# Patient Record
Sex: Female | Born: 1980
Health system: Southern US, Community
[De-identification: ages and names within clinical notes are randomized; demographics above are authoritative.]

## PROBLEM LIST (undated history)

## (undated) DIAGNOSIS — N83209 Unspecified ovarian cyst, unspecified side: Secondary | ICD-10-CM

## (undated) HISTORY — PX: WISDOM TOOTH EXTRACTION: SHX21

---

## 2010-08-07 ENCOUNTER — Encounter: Payer: Self-pay | Admitting: Family Medicine

## 2010-08-07 ENCOUNTER — Inpatient Hospital Stay (INDEPENDENT_AMBULATORY_CARE_PROVIDER_SITE_OTHER)
Admission: RE | Admit: 2010-08-07 | Discharge: 2010-08-07 | Disposition: A | Discharge status: Home / Self Care | Payer: BC Managed Care – PPO | Source: Ambulatory Visit | Attending: Family Medicine | Admitting: Family Medicine

## 2010-08-07 DIAGNOSIS — J069 Acute upper respiratory infection, unspecified: Secondary | ICD-10-CM

## 2010-12-14 NOTE — Progress Notes (Signed)
Summary: POSSIBLE SINUS INFECTION (rm 5)   Vital Signs:  Patient Profile:   30 Years Old Female CC:      hoarse, dry cough, congestion x 2 days Height:     63 inches Weight:      120 pounds O2 Sat:      100 % O2 treatment:    Room Air Temp:     99.0 degrees F oral Pulse rate:   102 / minute Resp:     16 per minute BP sitting:   106 / 73  (left arm) Cuff size:   regular  Vitals Entered By: Lajean Saver RN (August 07, 2010 8:34 AM)                  Updated Prior Medication List: No Medications Current Allergies: No known allergies History of Present Illness Chief Complaint: hoarse, dry cough, congestion x 2 days History of Present Illness:  Subjective: Patient complains of URI symptoms that started 2 days ago with scratchy throat, sinus congestion and hoarseness. + mild cough developed yesterday, non-productive No pleuritic pain No wheezing ?post-nasal drainage No sinus pain/pressure No itchy/red eyes No earache No hemoptysis No SOB No fever, + chills No nausea No vomiting No abdominal pain No diarrhea No skin rashes + fatigue No myalgias No headache Used OTC meds without relief   REVIEW OF SYSTEMS Constitutional Symptoms      Denies fever, chills, night sweats, weight loss, weight gain, and fatigue.  Eyes       Denies change in vision, eye pain, eye discharge, glasses, contact lenses, and eye surgery. Ear/Nose/Throat/Mouth       Complains of sinus problems and hoarseness.      Denies hearing loss/aids, change in hearing, ear pain, ear discharge, dizziness, frequent runny nose, frequent nose bleeds, sore throat, and tooth pain or bleeding.      Comments: scratchy throat Respiratory       Complains of dry cough.      Denies productive cough, wheezing, shortness of breath, asthma, bronchitis, and emphysema/COPD.  Cardiovascular       Denies murmurs, chest pain, and tires easily with exhertion.    Gastrointestinal       Denies stomach pain,  nausea/vomiting, diarrhea, constipation, blood in bowel movements, and indigestion. Genitourniary       Denies painful urination, kidney stones, and loss of urinary control. Neurological       Denies paralysis, seizures, and fainting/blackouts. Musculoskeletal       Denies muscle pain, joint pain, joint stiffness, decreased range of motion, redness, swelling, muscle weakness, and gout.  Skin       Denies bruising, unusual mles/lumps or sores, and hair/skin or nail changes.  Psych       Denies mood changes, temper/anger issues, anxiety/stress, speech problems, depression, and sleep problems. Other Comments: Patient c/o symptoms x 2 days. Taken DayQuil and tylenol Cold. Her son has a recent uncomformed diagnosis of pertusis   Past History:  Past Medical History: Unremarkable  Past Surgical History: Denies surgical history  Social History: Married Never Smoked Alcohol use-no Drug use-no Smoking Status:  never Drug Use:  no   Objective:  Appearance:  Patient appears healthy, stated age, and in no acute distress  Eyes:  Pupils are equal, round, and reactive to light and accomodation.  Extraocular movement is intact.  Conjunctivae are not inflamed.  Ears:  Canals normal.  Tympanic membranes normal.   Nose:  Mildly congested turbinates.  No sinus tenderness  Pharynx:  Normal  Neck:  Supple.  Slightly tender shotty posterior nodes are palpated bilaterally.  Lungs:  Clear to auscultation.  Breath sounds are equal.  Heart:  Regular rate and rhythm without murmurs, rubs, or gallops.  Abdomen:  Nontender without masses or hepatosplenomegaly.  Bowel sounds are present.  No CVA or flank tenderness.  Extremities:  No edema.   Skin:  No rash Assessment New Problems: UPPER RESPIRATORY INFECTION, ACUTE (ICD-465.9)  NO EVIDENCE BACTERIAL INFECTION TODAY  Plan New Medications/Changes: AZITHROMYCIN 250 MG TABS (AZITHROMYCIN) Two tabs by mouth on day 1, then 1 tab daily on days 2 through  5 (Rx void after 08/16/10)  #6 tabs x 0, 08/07/2010, Donna Christen MD BENZONATATE 200 MG CAPS (BENZONATATE) One by mouth hs as needed cough  #12 x 0, 08/07/2010, Donna Christen MD  New Orders: Pulse Oximetry (single measurment) [16109] New Patient Level III [60454] Planning Comments:   Treat symptomatically for now:  Increase fluid intake, begin expectorant/decongestant, topical decongestant,  cough suppressant at bedtime.  If fever/chills/sweats persist, or if not improving 5  days begin Z-pack (given Rx to hold).  Followup with PCP if not improving 7 to 10 days.   The patient and/or caregiver has been counseled thoroughly with regard to medications prescribed including dosage, schedule, interactions, rationale for use, and possible side effects and they verbalize understanding.  Diagnoses and expected course of recovery discussed and will return if not improved as expected or if the condition worsens. Patient and/or caregiver verbalized understanding.  Prescriptions: AZITHROMYCIN 250 MG TABS (AZITHROMYCIN) Two tabs by mouth on day 1, then 1 tab daily on days 2 through 5 (Rx void after 08/16/10)  #6 tabs x 0   Entered and Authorized by:   Donna Christen MD   Signed by:   Donna Christen MD on 08/07/2010   Method used:   Print then Give to Patient   RxID:   [id number] BENZONATATE 200 MG CAPS (BENZONATATE) One by mouth hs as needed cough  #12 x 0   Entered and Authorized by:   Donna Christen MD   Signed by:   Donna Christen MD on 08/07/2010   Method used:   Print then Give to Patient   RxID:   [id number]   Patient Instructions: 1)  Take Mucinex D (guaifenesin with decongestant) twice daily for congestion. 2)  Increase fluid intake, rest. 3)  May use Afrin nasal spray (or generic oxymetazoline) twice daily for about 5 days.  Also recommend using saline nasal spray several times daily and/or saline nasal irrigation. 4)  Begin Azithromycin if not improving about 5 days or if persistent  fever develops. 5)  Followup with family doctor if not improving 7 to 10 days.   Orders Added: 1)  Pulse Oximetry (single measurment) [94760] 2)  New Patient Level III [41324]

## 2011-07-23 ENCOUNTER — Emergency Department
Admission: EM | Admit: 2011-07-23 | Discharge: 2011-07-23 | Disposition: A | Discharge status: Home / Self Care | Payer: BC Managed Care – PPO | Source: Home / Self Care

## 2011-07-23 ENCOUNTER — Telehealth: Payer: Self-pay | Admitting: Family Medicine

## 2011-07-23 DIAGNOSIS — J029 Acute pharyngitis, unspecified: Secondary | ICD-10-CM

## 2011-07-23 DIAGNOSIS — J069 Acute upper respiratory infection, unspecified: Secondary | ICD-10-CM

## 2011-07-23 MED ORDER — AMOXICILLIN 500 MG PO CAPS: 1000.0000 mg | ORAL_CAPSULE | Freq: Every day | ORAL | Status: AC

## 2011-07-23 NOTE — ED Provider Notes (Signed)
History     CSN: [id number]  Arrival date & time 07/23/11  1537   First MD Initiated Contact with Patient 07/23/11 1542      Chief Complaint  Patient presents with  . Sore Throat    4 days  HPI Comments: Sore throat x 15-66 days 30 year old son with + strep test earlier today. Per pt, sxs started one day after onset of son's sxs. + sore throat  No rhinorrhea or nasal congestion.  No cough + fatigue.  + LAD.  No nausea, vomiting, fevers, chills.   Patient is a 31 y.o. female presenting with pharyngitis.  Sore Throat This is a new problem. Episode onset: 2-3 days  The problem occurs constantly. Nothing aggravates the symptoms. Nothing relieves the symptoms. She has tried nothing for the symptoms.    History reviewed. No pertinent past medical history.  History reviewed. No pertinent past surgical history.  Family History  Problem Relation Age of Onset  . Cancer Mother 56    Breast    History  Substance Use Topics  . Smoking status: Never Smoker   . Smokeless tobacco: Never Used  . Alcohol Use: No    OB History    Grav Para Term Preterm Abortions TAB SAB Ect Mult Living                  Review of Systems  All other systems reviewed and are negative.    Allergies  Review of patient's allergies indicates no known allergies.  Home Medications   Current Outpatient Rx  Name Route Sig Dispense Refill  . AMOXICILLIN 500 MG PO CAPS Oral Take 2 capsules (1,000 mg total) by mouth daily. 20 capsule 0    BP 115/78  Pulse 64  Temp 97.8 F (36.6 C) (Oral)  Resp 18  Ht 5\' 4"  (1.626 m)  Wt 109 lb (49.442 kg)  BMI 18.71 kg/m2  SpO2 99%  LMP 07/09/2011  Physical Exam  Constitutional: She appears well-developed and well-nourished.  HENT:  Head: Normocephalic and atraumatic.  Right Ear: External ear normal.  Left Ear: External ear normal.       + post oropharyngeal erythema, minimal tonsillar exudate   Eyes: Pupils are equal, round, and reactive to light.    Neck: Normal range of motion.  Cardiovascular: Normal rate and regular rhythm.   Pulmonary/Chest: Effort normal and breath sounds normal.  Musculoskeletal: Normal range of motion.  Lymphadenopathy:    She has cervical adenopathy.  Neurological: She is alert.  Skin: Skin is warm. No rash noted. No erythema.  Psychiatric: She has a normal mood and affect.    ED Course  Procedures (including critical care time)   Labs Reviewed  POCT RAPID STREP A (OFFICE) - Normal  STREP A DNA PROBE  POCT URINALYSIS DIP (MANUAL ENTRY)   No results found.   No diagnosis found.    MDM  Pharyngitis:  Will treat for strep given sick contact and illness time line.  Discussed general and infectious red flags.  Handout given.  Follow up as needed.     The patient and/or caregiver has been counseled thoroughly with regard to treatment plan and/or medications prescribed including dosage, schedule, interactions, rationale for use, and possible side effects and they verbalize understanding. Diagnoses and expected course of recovery discussed and will return if not improved as expected or if the condition worsens. Patient and/or caregiver verbalized understanding.  Floydene Flock, MD 07/23/11 [phone]

## 2011-07-23 NOTE — ED Provider Notes (Signed)
Agree with exam, assessment, and plan.   Lattie Haw, MD 07/23/11 2242

## 2011-07-23 NOTE — ED Notes (Signed)
Taylor Simon complains of sore throat and body aches for 4 days. She has been exposed to strep. Her son is positive for strep. Denies fever, chills or sweats.

## 2011-07-24 ENCOUNTER — Telehealth: Payer: Self-pay | Admitting: Family Medicine

## 2012-05-19 ENCOUNTER — Emergency Department
Admission: EM | Admit: 2012-05-19 | Discharge: 2012-05-19 | Disposition: A | Discharge status: Home / Self Care | Payer: BC Managed Care – PPO | Source: Home / Self Care | Attending: Family Medicine | Admitting: Family Medicine

## 2012-05-19 ENCOUNTER — Encounter: Payer: Self-pay | Admitting: Emergency Medicine

## 2012-05-19 DIAGNOSIS — J069 Acute upper respiratory infection, unspecified: Secondary | ICD-10-CM

## 2012-05-19 MED ORDER — AZITHROMYCIN 250 MG PO TABS: ORAL_TABLET | ORAL | Status: DC

## 2012-05-19 MED ORDER — BENZONATATE 200 MG PO CAPS: 200.0000 mg | ORAL_CAPSULE | Freq: Every day | ORAL | Status: DC

## 2012-05-19 NOTE — ED Notes (Signed)
Patient reports nasal congestion, cough with irritated throat, and low grade fever x 6 days; worsening despite OTCs. Tylenol today at 0830.

## 2012-05-19 NOTE — ED Provider Notes (Signed)
History     CSN: [id number]  Arrival date & time 05/19/12  1228   First MD Initiated Contact with Patient 05/19/12 1252      Chief Complaint  Patient presents with  . Nasal Congestion  . Cough  . Sore Throat       HPI Comments: Patient complains of approximately 6 day history of gradually progressive URI symptoms beginning with a mild sore throat (now improved) and cough, followed by progressive nasal congestion.    Complains of fatigue and initial myalgias.  Cough is now worse at night and generally non-productive during the day.  There has been no pleuritic pain, shortness of breath, or wheezes.   The history is provided by the patient.    History reviewed. No pertinent past medical history.  History reviewed. No pertinent past surgical history.  Family History  Problem Relation Age of Onset  . Cancer Mother 21    Breast    History  Substance Use Topics  . Smoking status: Never Smoker   . Smokeless tobacco: Never Used  . Alcohol Use: No    OB History   Grav Para Term Preterm Abortions TAB SAB Ect Mult Living                  Review of Systems + sore throat + cough No pleuritic pain No wheezing + nasal congestion + post-nasal drainage No sinus pain/pressure No itchy/red eyes No earache No hemoptysis No SOB No fever, + chills No nausea No vomiting No abdominal pain No diarrhea No urinary symptoms No skin rashes + fatigue No myalgias No headache Used OTC meds without relief  Allergies  Review of patient's allergies indicates no known allergies.  Home Medications   Current Outpatient Rx  Name  Route  Sig  Dispense  Refill  . azithromycin (ZITHROMAX Z-PAK) 250 MG tablet      Take 2 tabs today; then begin one tab once daily for 4 more days. (Rx void after 05/27/12)   6 each   0   . benzonatate (TESSALON) 200 MG capsule   Oral   Take 1 capsule (200 mg total) by mouth at bedtime.   12 capsule   0     BP 98/65  Temp(Src) 98.2 F (36.8  C) (Oral)  Resp 16  Ht 5\' 4"  (1.626 m)  Wt 108 lb (48.988 kg)  BMI 18.53 kg/m2  SpO2 100%  LMP 05/15/2012  Physical Exam Nursing notes and Vital Signs reviewed. Appearance:  Patient appears healthy, stated age, and in no acute distress Eyes:  Pupils are equal, round, and reactive to light and accomodation.  Extraocular movement is intact.  Conjunctivae are not inflamed  Ears:  Canals normal.  Tympanic membranes normal.  Nose:  Mildly congested turbinates.  No sinus tenderness.   Pharynx:  Normal Neck:  Supple.   Tender shotty anterior/posterior nodes are palpated bilaterally  Lungs:  Clear to auscultation.  Breath sounds are equal.  Heart:  Regular rate and rhythm without murmurs, rubs, or gallops.  Abdomen:  Nontender without masses or hepatosplenomegaly.  Bowel sounds are present.  No CVA or flank tenderness.  Extremities:  No edema.  No calf tenderness Skin:  No rash present.   ED Course  Procedures  none  Labs Reviewed  STREP A DNA PROBE pending  POCT RAPID STREP A (OFFICE) negative      1. Acute upper respiratory infections of unspecified site; suspect viral URI       MDM  There is no evidence of bacterial infection today.   Treat symptomatically for now  Prescription written for Benzonatate (Tessalon) to take at bedtime for night-time cough.  Take Mucinex D (guaifenesin with decongestant) twice daily for congestion.  Increase fluid intake, rest. May use Afrin nasal spray (or generic oxymetazoline) twice daily for about 5 days.  Also recommend using saline nasal spray several times daily and saline nasal irrigation (AYR is a common brand) Stop all antihistamines for now, and other non-prescription cough/cold preparations. Begin Azithromycin if not improving about 5 days or if persistent fever develops (Given a prescription to hold, with an expiration date)  Follow-up with family doctor if not improving 7 to 10 days.         Lattie Haw, MD 05/19/12  1359

## 2012-12-06 ENCOUNTER — Emergency Department
Admission: EM | Admit: 2012-12-06 | Discharge: 2012-12-06 | Disposition: A | Discharge status: Home / Self Care | Payer: BC Managed Care – PPO | Source: Home / Self Care | Attending: Family Medicine | Admitting: Family Medicine

## 2012-12-06 ENCOUNTER — Emergency Department (INDEPENDENT_AMBULATORY_CARE_PROVIDER_SITE_OTHER): Payer: BC Managed Care – PPO

## 2012-12-06 ENCOUNTER — Encounter: Payer: Self-pay | Admitting: Emergency Medicine

## 2012-12-06 DIAGNOSIS — J069 Acute upper respiratory infection, unspecified: Secondary | ICD-10-CM

## 2012-12-06 DIAGNOSIS — R079 Chest pain, unspecified: Secondary | ICD-10-CM

## 2012-12-06 DIAGNOSIS — R05 Cough: Secondary | ICD-10-CM

## 2012-12-06 MED ORDER — BENZONATATE 200 MG PO CAPS: 200.0000 mg | ORAL_CAPSULE | Freq: Every day | ORAL | Status: DC

## 2012-12-06 MED ORDER — AZITHROMYCIN 250 MG PO TABS: ORAL_TABLET | ORAL | Status: DC

## 2012-12-06 NOTE — ED Provider Notes (Signed)
CSN: [id number]     Arrival date & time 12/06/12  1218 History   First MD Initiated Contact with Patient 12/06/12 1242     Chief Complaint  Patient presents with  . Nasal Congestion  . Cough  . Generalized Body Aches  . Chest Pain      HPI Comments: Patient developed a cold about 3 weeks ago, and symptoms have persisted although she did not feel ill initially.  The cough has become worse, and this morning she had increased sore throat, myalgias, and anterior chest tightness.  No fevers, chills, and sweats   The history is provided by the patient.    History reviewed. No pertinent past medical history. History reviewed. No pertinent past surgical history. Family History  Problem Relation Age of Onset  . Cancer Mother 10    Breast   History  Substance Use Topics  . Smoking status: Never Smoker   . Smokeless tobacco: Never Used  . Alcohol Use: No   OB History   Grav Para Term Preterm Abortions TAB SAB Ect Mult Living                 Review of Systems + sore throat + cough No pleuritic pain, but has tightness in anterior chest No wheezing + nasal congestion + post-nasal drainage No sinus pain/pressure No itchy/red eyes No earache No hemoptysis No SOB No fever/chills No nausea No vomiting No abdominal pain No diarrhea No urinary symptoms No skin rash + fatigue + myalgias No headache Used OTC meds without relief   Allergies  Review of patient's allergies indicates no known allergies.  Home Medications   Current Outpatient Rx  Name  Route  Sig  Dispense  Refill  . azithromycin (ZITHROMAX Z-PAK) 250 MG tablet      Take 2 tabs today; then begin one tab once daily for 4 more days.   6 each   0   . benzonatate (TESSALON) 200 MG capsule   Oral   Take 1 capsule (200 mg total) by mouth at bedtime. Take as needed for cough   12 capsule   0    BP 93/63  Pulse 95  Temp(Src) 97.9 F (36.6 C) (Oral)  Resp 16  Ht 5\' 4"  (1.626 m)  Wt 108 lb (48.988 kg)   BMI 18.53 kg/m2  SpO2 100%  LMP 11/24/2012 Physical Exam Nursing notes and Vital Signs reviewed. Appearance:  Patient appears healthy, stated age, and in no acute distress Eyes:  Pupils are equal, round, and reactive to light and accomodation.  Extraocular movement is intact.  Conjunctivae are not inflamed  Ears:  Canals normal.  Tympanic membranes normal.  Nose:  Mildly congested turbinates.  No sinus tenderness.   Pharynx:  Normal Neck:  Supple.   Tender shotty anterior/posterior nodes are palpated bilaterally  Lungs:  Clear to auscultation.  Breath sounds are equal. Chest:  Distinct tenderness to palpation over the mid-sternum.   Heart:  Regular rate and rhythm without murmurs, rubs, or gallops.  Abdomen:  Nontender without masses or hepatosplenomegaly.  Bowel sounds are present.  No CVA or flank tenderness.  Extremities:  No edema.  No calf tenderness Skin:  No rash present.   ED Course  Procedures  none    Imaging Review Dg Chest 2 View  12/06/2012   CLINICAL DATA:  Dry cough for 3 weeks, some chest pain  EXAM: CHEST  2 VIEW  COMPARISON:  None.  FINDINGS: The lungs are clear. No infiltrate  or effusion is seen. Mediastinal contours appear normal. The heart is within normal limits in size. No bony abnormality is seen.  IMPRESSION: No active lung disease.   Electronically Signed   By: Dwyane Dee M.D.   On: 12/06/2012 13:08      MDM   1. Acute upper respiratory infections of unspecified site        Begin Z-pack to cover atypicals.  Prescription written for Benzonatate [facility]) to take at bedtime for night-time cough.  Take Mucinex D (guaifenesin with decongestant) twice daily for congestion.  Increase fluid intake, rest. May use Afrin nasal spray (or generic oxymetazoline) twice daily for about 5 days.  Also recommend using saline nasal spray several times daily and saline nasal irrigation (AYR is a common brand) Stop all antihistamines for now, and other non-prescription  cough/cold preparations. May take Ibuprofen 200mg , 4 tabs every 8 hours with food for chest/sternum discomfort. Follow-up with family doctor if not improving about10 days.     Lattie Haw, MD 12/08/12 [phone]

## 2012-12-06 NOTE — ED Notes (Signed)
Reports 3 week history of congestion, cough, hoarseness, rib pain with coughing; denies known fever. Took Tylenol Cold combo at 0730 today. Did not have Flu vaccination this season; waiting until illness over.

## 2013-05-02 ENCOUNTER — Encounter: Payer: Self-pay | Admitting: Physician Assistant

## 2013-05-02 ENCOUNTER — Ambulatory Visit (INDEPENDENT_AMBULATORY_CARE_PROVIDER_SITE_OTHER): Payer: BC Managed Care – PPO | Admitting: Physician Assistant

## 2013-05-02 VITALS — BP 97/63 | HR 77 | Ht 64.0 in | Wt 109.0 lb

## 2013-05-02 DIAGNOSIS — J329 Chronic sinusitis, unspecified: Secondary | ICD-10-CM

## 2013-05-02 DIAGNOSIS — A499 Bacterial infection, unspecified: Secondary | ICD-10-CM

## 2013-05-02 DIAGNOSIS — B9689 Other specified bacterial agents as the cause of diseases classified elsewhere: Secondary | ICD-10-CM

## 2013-05-02 MED ORDER — AMOXICILLIN 500 MG PO CAPS: 500.0000 mg | ORAL_CAPSULE | Freq: Two times a day (BID) | ORAL | Status: DC

## 2013-05-02 NOTE — Patient Instructions (Signed)

## 2013-05-02 NOTE — Progress Notes (Signed)
   Subjective:    Patient ID: Taylor Simon, female    DOB: [birth year], 33 y.o.   MRN: [id number]  HPI Pt is a 33 yo female who presents to the clinic to establish care. No PMH. No medications. Comes in with sinus pressure, teeth pain, headache for 2 weeks. Started out with cold symptoms that have improved but now just a lot of pressure. Denies any fever, chills, rash, cough, ear pain, or ST. No wheezing or SOB. Tried sudafed, tylenol allergy with minimal relief.    . History   Social History  . Marital Status: Divorced    Spouse Name: N/A    Number of Children: N/A  . Years of Education: N/A   Occupational History  . Not on file.   Social History Main Topics  . Smoking status: Never Smoker   . Smokeless tobacco: Never Used  . Alcohol Use: Yes  . Drug Use: No  . Sexual Activity: Yes   Other Topics Concern  . Not on file   Social History Narrative  . No narrative on file   . Family History  Problem Relation Age of Onset  . Cancer Mother 5565    Breast       Review of Systems  All other systems reviewed and are negative.      Objective:   Physical Exam  Constitutional: She is oriented to person, place, and time. She appears well-developed and well-nourished.  HENT:  Head: Normocephalic and atraumatic.  Right Ear: External ear normal.  Left Ear: External ear normal.  Nose: Nose normal.  Mouth/Throat: Oropharynx is clear and moist.  TM's clear bilaterally.  Maxillary tenderness to palpation bilaterally.   Eyes: Conjunctivae are normal. Right eye exhibits no discharge. Left eye exhibits no discharge.  Neck: Normal range of motion. Neck supple.  Cardiovascular: Normal rate, regular rhythm and normal heart sounds.   Pulmonary/Chest: Effort normal and breath sounds normal. She has no wheezes.  Lymphadenopathy:    She has no cervical adenopathy.  Neurological: She is alert and oriented to person, place, and time.  Skin: Skin is dry.  Psychiatric: She has a normal  mood and affect. Her behavior is normal.          Assessment & Plan:  Sinusitis- will treat with amoxil for 10 days. Discussed flonase for nasal congestion. Gave HO. Continue ibuprofen. Follow up if not improving.   Discussed with pt need for CPE, labs and tdap.

## 2013-05-14 ENCOUNTER — Ambulatory Visit (INDEPENDENT_AMBULATORY_CARE_PROVIDER_SITE_OTHER): Payer: BC Managed Care – PPO | Admitting: Physician Assistant

## 2013-05-14 ENCOUNTER — Other Ambulatory Visit (HOSPITAL_COMMUNITY)
Admission: RE | Admit: 2013-05-14 | Discharge: 2013-05-14 | Disposition: A | Discharge status: Home / Self Care | Payer: BC Managed Care – PPO | Source: Ambulatory Visit | Attending: Family Medicine | Admitting: Family Medicine

## 2013-05-14 ENCOUNTER — Encounter: Payer: Self-pay | Admitting: Physician Assistant

## 2013-05-14 VITALS — BP 96/62 | HR 87 | Ht 63.5 in | Wt 106.0 lb

## 2013-05-14 DIAGNOSIS — Z13220 Encounter for screening for lipoid disorders: Secondary | ICD-10-CM

## 2013-05-14 DIAGNOSIS — Z01419 Encounter for gynecological examination (general) (routine) without abnormal findings: Secondary | ICD-10-CM | POA: Insufficient documentation

## 2013-05-14 DIAGNOSIS — Z113 Encounter for screening for infections with a predominantly sexual mode of transmission: Secondary | ICD-10-CM | POA: Insufficient documentation

## 2013-05-14 DIAGNOSIS — Z0000 Encounter for general adult medical examination without abnormal findings: Secondary | ICD-10-CM

## 2013-05-14 DIAGNOSIS — Z131 Encounter for screening for diabetes mellitus: Secondary | ICD-10-CM

## 2013-05-14 DIAGNOSIS — Z1151 Encounter for screening for human papillomavirus (HPV): Secondary | ICD-10-CM | POA: Insufficient documentation

## 2013-05-14 NOTE — Patient Instructions (Signed)

## 2013-05-15 LAB — COMPLETE METABOLIC PANEL WITH GFR
ALT: 11 U/L (ref 0–35)
AST: 19 U/L (ref 0–37)
Albumin: 4.3 g/dL (ref 3.5–5.2)
Alkaline Phosphatase: 53 U/L (ref 39–117)
BILIRUBIN TOTAL: 0.9 mg/dL (ref 0.2–1.2)
BUN: 8 mg/dL (ref 6–23)
CALCIUM: 9.1 mg/dL (ref 8.4–10.5)
CHLORIDE: 102 meq/L (ref 96–112)
CO2: 24 meq/L (ref 19–32)
CREATININE: 0.76 mg/dL (ref 0.50–1.10)
GLUCOSE: 90 mg/dL (ref 70–99)
Potassium: 4 mEq/L (ref 3.5–5.3)
Sodium: 135 mEq/L (ref 135–145)
TOTAL PROTEIN: 7.1 g/dL (ref 6.0–8.3)

## 2013-05-15 LAB — LIPID PANEL
Cholesterol: 167 mg/dL (ref 0–200)
HDL: 55 mg/dL (ref >39)
LDL CALC: 102 mg/dL — AB (ref 0–99)
TRIGLYCERIDES: 50 mg/dL (ref <150)
Total CHOL/HDL Ratio: 3 Ratio
VLDL: 10 mg/dL (ref 0–40)

## 2013-05-15 NOTE — Progress Notes (Signed)
  Subjective:     Taylor Simon is a 33 y.o. female and is here for a comprehensive physical exam. The patient reports no problems.  History   Social History  . Marital Status: Divorced    Spouse Name: N/A    Number of Children: N/A  . Years of Education: N/A   Occupational History  . Not on file.   Social History Main Topics  . Smoking status: Never Smoker   . Smokeless tobacco: Never Used  . Alcohol Use: Yes  . Drug Use: No  . Sexual Activity: Yes   Other Topics Concern  . Not on file   Social History Narrative  . No narrative on file   Health Maintenance  Topic Date Due  . Influenza Vaccine  08/11/2013  . Pap Smear  05/14/2016  . Tetanus/tdap  01/11/2018    The following portions of the patient's history were reviewed and updated as appropriate: allergies, current medications, past family history, past medical history, past social history, past surgical history and problem list.  Review of Systems A comprehensive review of systems was negative.   Objective:    BP 96/62  Pulse 87  Ht 5' 3.5" (1.613 m)  Wt 106 lb (48.081 kg)  BMI 18.48 kg/m2  SpO2 98%  LMP 04/23/2013 General appearance: alert, cooperative and appears stated age Head: Normocephalic, without obvious abnormality, atraumatic Eyes: conjunctivae/corneas clear. PERRL, EOM's intact. Fundi benign. Ears: normal TM's and external ear canals both ears Nose: Nares normal. Septum midline. Mucosa normal. No drainage or sinus tenderness. Throat: lips, mucosa, and tongue normal; teeth and gums normal Neck: no adenopathy, no carotid bruit, no JVD, supple, symmetrical, trachea midline and thyroid not enlarged, symmetric, no tenderness/mass/nodules Back: symmetric, no curvature. ROM normal. No CVA tenderness. Lungs: clear to auscultation bilaterally Breasts: normal appearance, no masses or tenderness Heart: regular rate and rhythm, S1, S2 normal, no murmur, click, rub or gallop Abdomen: soft, non-tender; bowel  sounds normal; no masses,  no organomegaly Pelvic: cervix normal in appearance, external genitalia normal, no adnexal masses or tenderness, no cervical motion tenderness, uterus normal size, shape, and consistency and vagina normal without discharge cervix tilted down and harder to find os. Extremities: extremities normal, atraumatic, no cyanosis or edema Pulses: 2+ and symmetric Skin: Skin color, texture, turgor normal. No rashes or lesions Lymph nodes: Cervical, supraclavicular, and axillary nodes normal. Neurologic: Grossly normal    Assessment:    Healthy female exam.      Plan:    Cpe- screening labs for lipid and glucose were ordered. Pap smear done today. STD panel added to pap. Vaccines up to date. Tdap 5 years ago with son. Depression screening 0/2, negative. Advance directives given HO. Discussed need for calcium 1200mg  and Vitamin D 800units daily. Encouraged to continue regular exercise.  See After Visit Summary for Counseling Recommendations

## 2013-07-30 ENCOUNTER — Ambulatory Visit (INDEPENDENT_AMBULATORY_CARE_PROVIDER_SITE_OTHER): Payer: BC Managed Care – PPO | Admitting: Advanced Practice Midwife

## 2013-07-30 ENCOUNTER — Encounter: Payer: Self-pay | Admitting: Advanced Practice Midwife

## 2013-07-30 VITALS — BP 113/73 | HR 71 | Wt 104.0 lb

## 2013-07-30 DIAGNOSIS — Z3481 Encounter for supervision of other normal pregnancy, first trimester: Secondary | ICD-10-CM

## 2013-07-30 DIAGNOSIS — N898 Other specified noninflammatory disorders of vagina: Secondary | ICD-10-CM

## 2013-07-30 DIAGNOSIS — Z3480 Encounter for supervision of other normal pregnancy, unspecified trimester: Secondary | ICD-10-CM

## 2013-07-30 NOTE — Progress Notes (Signed)
IUP was positive FHT  CRL 6.278mm and GA 6w3 d  Wants to defer PNL until next visit

## 2013-07-30 NOTE — Patient Instructions (Signed)
Vaginal Bleeding During Pregnancy, First Trimester  A small amount of bleeding (spotting) from the vagina is relatively common in early pregnancy. It usually stops on its own. Various things may cause bleeding or spotting in early pregnancy. Some bleeding may be related to the pregnancy, and some may not. In most cases, the bleeding is normal and is not a problem. However, bleeding can also be a sign of something serious. Be sure to tell your health care provider about any vaginal bleeding right away.  Some possible causes of vaginal bleeding during the first trimester include:  · Infection or inflammation of the cervix.  · Growths (polyps) on the cervix.  · Miscarriage or threatened miscarriage.  · Pregnancy tissue has developed outside of the uterus and in a fallopian tube (tubal pregnancy).  · Tiny cysts have developed in the uterus instead of pregnancy tissue (molar pregnancy).  HOME CARE INSTRUCTIONS   Watch your condition for any changes. The following actions may help to lessen any discomfort you are feeling:  · Follow your health care provider's instructions for limiting your activity. If your health care provider orders bed rest, you may need to stay in bed and only get up to use the bathroom. However, your health care provider may allow you to continue light activity.  · If needed, make plans for someone to help with your regular activities and responsibilities while you are on bed rest.  · Keep track of the number of pads you use each day, how often you change pads, and how soaked (saturated) they are. Write this down.  · Do not use tampons. Do not douche.  · Do not have sexual intercourse or orgasms until approved by your health care provider.  · If you pass any tissue from your vagina, save the tissue so you can show it to your health care provider.  · Only take over-the-counter or prescription medicines as directed by your health care provider.  · Do not take aspirin because it can make you  bleed.  · Keep all follow-up appointments as directed by your health care provider.  SEEK MEDICAL CARE IF:  · You have any vaginal bleeding during any part of your pregnancy.  · You have cramps or labor pains.  · You have a fever, not controlled by medicine.  SEEK IMMEDIATE MEDICAL CARE IF:   · You have severe cramps in your back or belly (abdomen).  · You pass large clots or tissue from your vagina.  · Your bleeding increases.  · You feel light-headed or weak, or you have fainting episodes.  · You have chills.  · You are leaking fluid or have a gush of fluid from your vagina.  · You pass out while having a bowel movement.  MAKE SURE YOU:  · Understand these instructions.  · Will watch your condition.  · Will get help right away if you are not doing well or get worse.  Document Released: 10/07/2004 Document Revised: 01/02/2013 Document Reviewed: 09/04/2012  ExitCare® Patient Information ©2015 [employer]. This information is not intended to replace advice given to you by your health care provider. Make sure you discuss any questions you have with your health care provider.

## 2013-07-30 NOTE — Progress Notes (Signed)
   Subjective:    Taylor Simon is a [id number] 9349w6d by LMP being seen today for her first obstetrical visit.  Her obstetrical history is significant for nothing. Patient does intend to breast feed. Pregnancy history fully reviewed.  Patient reports scant spotting and cramping 3/10 on pain scale last week. Ceasar Mons.  Filed Vitals:   07/30/13 1013  BP: 113/73  Pulse: 71  Weight: 47.174 kg (104 lb)    HISTORY: OB History  Gravida Para Term Preterm AB SAB TAB Ectopic Multiple Living  3 2 2       2     # Outcome Date GA Lbr Len/2nd Weight Sex Delivery Anes PTL Lv  3 CUR           2 TRM 09/13/09 4226w0d  3.629 kg (8 lb) M SVD EPI N Y  1 TRM 01/25/08 6652w0d  4.082 kg (9 lb) M SVD EPI N Y     Comments: vacumn     Past Medical History  Diagnosis Date  . Ovarian cyst    Past Surgical History  Procedure Laterality Date  . Wisdom tooth extraction     Family History  Problem Relation Age of Onset  . Cancer Mother 8865    Breast     Exam   BP 113/73  Pulse 71  Wt 47.174 kg (104 lb)  LMP 06/13/2013 6.3 week SIUP. Cardiac activity faintly seen by RN.   Uterus:     Pelvic Exam:    Perineum: Normal Perineum   Vulva: normal, Bartholin's, Urethra, Skene's normal   Vagina:  normal mucosa, normal discharge, no blood   pH: NA   Cervix: multiparous appearance, no cervical motion tenderness and no lesions   Adnexa: normal adnexa and no mass, fullness, tenderness   Bony Pelvis: average  System: Breast:  normal appearance, no masses or tenderness   Skin: normal coloration and turgor, no rashes    Neurologic: oriented, normal, grossly non-focal   Extremities: normal strength, tone, and muscle mass   HEENT sclera clear, anicteric   Mouth/Teeth mucous membranes moist, pharynx normal without lesions and dental hygiene good   Neck no masses   Cardiovascular: regular rate and rhythm, no murmurs or gallops   Respiratory:  appears well, vitals normal, no respiratory distress, acyanotic, normal RR,  neck free of mass or lymphadenopathy, chest clear, no wheezing, crepitations, rhonchi, normal symmetric air entry   Abdomen: soft, non-tender; bowel sounds normal; no masses,  no organomegaly   Urinary: urethral meatus normal      Assessment:    Pregnancy: G3P2002 1. Encounter for supervision of other normal pregnancy in first trimester  - GC/Chlamydia Probe Amp - CULTURE, URINE COMPREHENSIVE  2. Vaginal discharge     Plan:     Initial labs deferred until NV per pt preference. Prenatal vitamins. Problem list reviewed and updated. Genetic Screening discussed First Screen: undecided.  Ultrasound discussed; fetal survey: requested.  Follow up in 4 weeks. 50% of 30 min visit spent on counseling and coordination of care.  SAB precautions.    Dorathy KinsmanSMITH, Jacquees Gongora 08/02/2013

## 2013-07-31 LAB — CULTURE, URINE COMPREHENSIVE
COLONY COUNT: NO GROWTH
Organism ID, Bacteria: NO GROWTH

## 2013-08-01 ENCOUNTER — Other Ambulatory Visit: Payer: BC Managed Care – PPO

## 2013-08-01 ENCOUNTER — Inpatient Hospital Stay (HOSPITAL_COMMUNITY): Payer: BC Managed Care – PPO

## 2013-08-01 ENCOUNTER — Inpatient Hospital Stay (HOSPITAL_COMMUNITY)
Admission: AD | Admit: 2013-08-01 | Discharge: 2013-08-01 | Disposition: A | Discharge status: Home / Self Care | Payer: BC Managed Care – PPO | Source: Ambulatory Visit | Attending: Obstetrics & Gynecology | Admitting: Obstetrics & Gynecology

## 2013-08-01 ENCOUNTER — Encounter (HOSPITAL_COMMUNITY): Payer: Self-pay | Admitting: *Deleted

## 2013-08-01 DIAGNOSIS — O209 Hemorrhage in early pregnancy, unspecified: Secondary | ICD-10-CM

## 2013-08-01 DIAGNOSIS — R109 Unspecified abdominal pain: Secondary | ICD-10-CM | POA: Insufficient documentation

## 2013-08-01 DIAGNOSIS — O200 Threatened abortion: Secondary | ICD-10-CM | POA: Insufficient documentation

## 2013-08-01 HISTORY — DX: Unspecified ovarian cyst, unspecified side: N83.209

## 2013-08-01 LAB — GC/CHLAMYDIA PROBE AMP
CT Probe RNA: NEGATIVE
GC PROBE AMP APTIMA: NEGATIVE

## 2013-08-01 LAB — CBC
HCT: 38.3 % (ref 36.0–46.0)
HEMOGLOBIN: 13.7 g/dL (ref 12.0–15.0)
MCH: 30.6 pg (ref 26.0–34.0)
MCHC: 35.8 g/dL (ref 30.0–36.0)
MCV: 85.7 fL (ref 78.0–100.0)
PLATELETS: 177 10*3/uL (ref 150–400)
RBC: 4.47 MIL/uL (ref 3.87–5.11)
RDW: 12 % (ref 11.5–15.5)
WBC: 6.6 10*3/uL (ref 4.0–10.5)

## 2013-08-01 LAB — WET PREP, GENITAL
CLUE CELLS WET PREP: NONE SEEN
TRICH WET PREP: NONE SEEN
Yeast Wet Prep HPF POC: NONE SEEN

## 2013-08-01 LAB — HCG, QUANTITATIVE, PREGNANCY: HCG, BETA CHAIN, QUANT, S: 23972 m[IU]/mL — AB (ref <5)

## 2013-08-01 LAB — ABO/RH: ABO/RH(D): A POS

## 2013-08-01 MED ORDER — HYDROCODONE-ACETAMINOPHEN 5-325 MG PO TABS: 1.0000 | ORAL_TABLET | ORAL | Status: DC | PRN

## 2013-08-01 MED ORDER — HYDROCODONE-ACETAMINOPHEN 5-325 MG PO TABS
1.0000 | ORAL_TABLET | Freq: Once | ORAL | Status: AC
  Administered 2013-08-01: 1 via ORAL
  Filled 2013-08-01: qty 1

## 2013-08-01 NOTE — MAU Provider Note (Signed)
History     CSN: [id number]  Arrival date and time: 08/01/13 1432   First Provider Initiated Contact with Patient 08/01/13 1635      Chief Complaint  Patient presents with  . Abdominal Pain  . Vaginal Bleeding   HPIPt is G3P2002 @ 5765w5d pregnant[facility].  Pt was initially seen on 07/30/2013 and  Had confirmed viable IUP with informal ultrasound .  Today pt started having spotting/bleeding today with cramping- [facility]  [facility] and told pt there was no heart beat.  Pt was sent to MAU to discuss options. Pt has not taken anything for pain.  Pt states the bleeding will be dark and then bright red- spotting and then heavy. Records [place] [facility] [place].  Pt had ultrasound with [facility] on Monday. Pt started spotting and cramping yesterday and went to office today. FH no longer noted on US.     Past Medical History  Diagnosis Date  . Ovarian cyst     Past Surgical History  Procedure Laterality Date  . Wisdom tooth extraction      Family History  Problem Relation Age of Onset  . Cancer Mother 9365    Breast    History  Substance Use Topics  . Smoking status: Never Smoker   . Smokeless tobacco: Never Used  . Alcohol Use: Yes     Comment: occassion    Allergies: No Known Allergies  Prescriptions prior to admission  Medication Sig Dispense Refill  . Prenatal MV-Min-Fe Fum-FA-DHA (PRENATAL 1 PO) Take by mouth.        Review of Systems  Constitutional: Negative for fever and chills.  Gastrointestinal: Positive for abdominal pain. Negative for nausea, vomiting, diarrhea and constipation.  Genitourinary: Negative for dysuria.  Musculoskeletal: Positive for back pain.   Physical Exam   Blood pressure 103/68, pulse 81, temperature 98.7 F (37.1 C), temperature source Oral, resp. rate 16, height 5' 2.5" (1.588 m), weight 105 lb 3.2 oz (47.718 kg), last menstrual period 06/13/2013, SpO2  100.00%.  Physical Exam  Nursing note and vitals reviewed. Constitutional: She is oriented to person, place, and time. She appears well-developed and well-nourished.  Uncomfortable appearing  HENT:  Head: Normocephalic.  Eyes: Pupils are equal, round, and reactive to light.  Neck: Normal range of motion.  Cardiovascular: Normal rate.   Respiratory: Effort normal.  GI: There is tenderness. There is no rebound.  Genitourinary:  Small amount of creamy brown discharge in vault; cervix closed, NT; uterus NSSC NT  Musculoskeletal: Normal range of motion.  Neurological: She is alert and oriented to person, place, and time.  Skin: Skin is warm and dry.  Psychiatric: She has a normal mood and affect.    MAU Course  Procedures Results for orders placed during the hospital encounter of 08/01/13 (from the past 24 hour(s))  CBC     Status: None   Collection Time    08/01/13  4:51 PM      Result Value Ref Range   WBC 6.6  4.0 - 10.5 K/uL   RBC 4.47  3.87 - 5.11 MIL/uL   Hemoglobin 13.7  12.0 - 15.0 g/dL   HCT 08.638.3  57.836.0 - 46.946.0 %   MCV 85.7  78.0 - 100.0 fL   MCH 30.6  26.0 - 34.0 pg   MCHC 35.8  30.0 - 36.0 g/dL   RDW 62.912.0  52.811.5 - 41.315.5 %  Platelets 177  150 - 400 K/uL  HCG, QUANTITATIVE, PREGNANCY     Status: Abnormal   Collection Time    08/01/13  4:51 PM      Result Value Ref Range   hCG, Beta Chain, [place] Longest 23972 (*) <5 mIU/mL  ABO/RH     Status: None   Collection Time    08/01/13  4:51 PM      Result Value Ref Range   ABO/RH(D) A POS    US Ob Comp Less 14 Wks  08/01/2013   CLINICAL DATA:  Absence of fetal heart tones. Pelvic pain with bleeding.  EXAM: OBSTETRIC <14 WK [place] AND TRANSVAGINAL OB US  TECHNIQUE: Both transabdominal and transvaginal ultrasound examinations were performed for complete evaluation of the gestation as well as the maternal uterus, adnexal regions, and pelvic cul-de-sac. Transvaginal technique was performed to assess early pregnancy.  COMPARISON:  None.   FINDINGS: Intrauterine gestational sac: Present, mildly elongated.  Yolk sac:  Present  Embryo:  Present  Cardiac Activity: None.  CRL:   4  mm   6 w 1 d                  [place] EDC: 03/26/2014  Maternal uterus/adnexae: Both ovaries appear normal. Negative for subchorionic hemorrhage. No free fluid.  IMPRESSION: Findings are suspicious but not yet definitive for failed pregnancy. Recommend follow-up US in 10-14 days for definitive diagnosis. This recommendation follows SRU consensus guidelines: Diagnostic Criteria for Nonviable Pregnancy Early in the First Trimester. Malva Limes Med 2013; 161:0960-45.   Electronically Signed   By: Andreas Newport M.D.   On: 08/01/2013 17:56   US Ob Transvaginal  08/01/2013   CLINICAL DATA:  Absence of fetal heart tones. Pelvic pain with bleeding.  EXAM: OBSTETRIC <14 WK [place] AND TRANSVAGINAL OB US  TECHNIQUE: Both transabdominal and transvaginal ultrasound examinations were performed for complete evaluation of the gestation as well as the maternal uterus, adnexal regions, and pelvic cul-de-sac. Transvaginal technique was performed to assess early pregnancy.  COMPARISON:  None.  FINDINGS: Intrauterine gestational sac: Present, mildly elongated.  Yolk sac:  Present  Embryo:  Present  Cardiac Activity: None.  CRL:   4  mm   6 w 1 d                  [place] EDC: 03/26/2014  Maternal uterus/adnexae: Both ovaries appear normal. Negative for subchorionic hemorrhage. No free fluid.  IMPRESSION: Findings are suspicious but not yet definitive for failed pregnancy. Recommend follow-up US in 10-14 days for definitive diagnosis. This recommendation follows SRU consensus guidelines: Diagnostic Criteria for Nonviable Pregnancy Early in the First Trimester. Malva Limes Med 2013; 409:8119-14.   Electronically Signed   By: Andreas Newport M.D.   On: 08/01/2013 17:56  discussed with Dr. Despina Hidden- repeat US on Monday Discussed at length with pt and husband that this is probably a failed pregnancy but because we do not  have a formal report from previous ultrasound, we cannot diagnose failed pregnancy.  From ultrasound report- it does not appear that ab is in progress. Lortab given to pt while in MAU  Assessment and Plan  Bleeding in pregnancy- probably failed pregnancy Return on Monday for repeat Ultrasound Return sooner if increase in pain or bleeding Rx for Lortab  Daneil Beem 08/01/2013, 4:36 PM

## 2013-08-01 NOTE — Progress Notes (Signed)
Pt presents in office today with bright red vaginal bleeding and cramping since yesterday.  Bedside U/S today showed IUP with CRL 5.1 mm but no FHT seen today.  Sending to MAU for evaluation.  Explained to pt that there is not anything that this was not her fault.  Both patient and husband very tearful.  Pt stated that she just thought she would spot and it would get better.

## 2013-08-01 NOTE — MAU Note (Signed)
Pt had ultrasound with [facility] on Monday. Pt started spotting and cramping yesterday and went to office today. FH no longer noted on US.

## 2013-08-01 NOTE — Discharge Instructions (Signed)
Threatened Miscarriage °A threatened miscarriage occurs when you have vaginal bleeding during your first 20 weeks of pregnancy but the pregnancy has not ended. If you have vaginal bleeding during this time, your health care provider will do tests to make sure you are still pregnant. If the tests show you are still pregnant and the developing baby (fetus) inside your womb (uterus) is still growing, your condition is considered a threatened miscarriage. °A threatened miscarriage does not mean your pregnancy will end, but it does increase the risk of losing your pregnancy (complete miscarriage). °CAUSES  °The cause of a threatened miscarriage is usually not known. If you go on to have a complete miscarriage, the most common cause is an abnormal number of chromosomes in the developing baby. Chromosomes are the structures inside cells that hold all your genetic material. °Some causes of vaginal bleeding that do not result in miscarriage include: °· Having sex. °· Having an infection. °· Normal hormone changes of pregnancy. °· Bleeding that occurs when an egg implants in your uterus. °RISK FACTORS °Risk factors for bleeding in early pregnancy include: °· Obesity. °· Smoking. °· Drinking excessive amounts of alcohol or caffeine. °· Recreational drug use. °SIGNS AND SYMPTOMS °· Light vaginal bleeding. °· Mild abdominal pain or cramps. °DIAGNOSIS  °If you have bleeding with or without abdominal pain before 20 weeks of pregnancy, your health care provider will do tests to check whether you are still pregnant. One important test involves using sound waves and a computer (ultrasound) to create images of the inside of your uterus. Other tests include an internal exam of your vagina and uterus (pelvic exam) and measurement of your baby's heart rate.  °You may be diagnosed with a threatened miscarriage if: °· Ultrasound testing shows you are still pregnant. °· Your baby's heart rate is strong. °· A pelvic exam shows that the  opening between your uterus and your vagina (cervix) is closed. °· Your heart rate and blood pressure are stable. °· Blood tests confirm you are still pregnant. °TREATMENT  °No treatments have been shown to prevent a threatened miscarriage from going on to a complete miscarriage. However, the right home care is important.  °HOME CARE INSTRUCTIONS  °· Make sure you keep all your appointments for prenatal care. This is very important. °· Get plenty of rest. °· Do not have sex or use tampons if you have vaginal bleeding. °· Do not douche. °· Do not smoke or use recreational drugs. °· Do not drink alcohol. °· Avoid caffeine. °SEEK MEDICAL CARE IF: °· You have light vaginal bleeding or spotting while pregnant. °· You have abdominal pain or cramping. °· You have a fever. °SEEK IMMEDIATE MEDICAL CARE IF: °· You have heavy vaginal bleeding. °· You have blood clots coming from your vagina. °· You have severe low back pain or abdominal cramps. °· You have fever, chills, and severe abdominal pain. °MAKE SURE YOU: °· Understand these instructions. °· Will watch your condition. °· Will get help right away if you are not doing well or get worse. °Document Released: 12/28/2004 Document Revised: 01/02/2013 Document Reviewed: 10/24/2012 °ExitCare® Patient Information ©2015 [employer]. This information is not intended to replace advice given to you by your health care provider. Make sure you discuss any questions you have with your health care provider. ° °Vaginal Bleeding During Pregnancy, First Trimester °A small amount of bleeding (spotting) from the vagina is relatively common in early pregnancy. It usually stops on its own. Various things may cause bleeding   or spotting in early pregnancy. Some bleeding may be related to the pregnancy, and some may not. In most cases, the bleeding is normal and is not a problem. However, bleeding can also be a sign of something serious. Be sure to tell your health care provider about any  vaginal bleeding right away. °Some possible causes of vaginal bleeding during the first trimester include: °· Infection or inflammation of the cervix. °· Growths (polyps) on the cervix. °· Miscarriage or threatened miscarriage. °· Pregnancy tissue has developed outside of the uterus and in a fallopian tube (tubal pregnancy). °· Tiny cysts have developed in the uterus instead of pregnancy tissue (molar pregnancy). °HOME CARE INSTRUCTIONS  °Watch your condition for any changes. The following actions may help to lessen any discomfort you are feeling: °· Follow your health care provider's instructions for limiting your activity. If your health care provider orders bed rest, you may need to stay in bed and only get up to use the bathroom. However, your health care provider may allow you to continue light activity. °· If needed, make plans for someone to help with your regular activities and responsibilities while you are on bed rest. °· Keep track of the number of pads you use each day, how often you change pads, and how soaked (saturated) they are. Write this down. °· Do not use tampons. Do not douche. °· Do not have sexual intercourse or orgasms until approved by your health care provider. °· If you pass any tissue from your vagina, save the tissue so you can show it to your health care provider. °· Only take over-the-counter or prescription medicines as directed by your health care provider. °· Do not take aspirin because it can make you bleed. °· Keep all follow-up appointments as directed by your health care provider. °SEEK MEDICAL CARE IF: °· You have any vaginal bleeding during any part of your pregnancy. °· You have cramps or labor pains. °· You have a fever, not controlled by medicine. °SEEK IMMEDIATE MEDICAL CARE IF:  °· You have severe cramps in your back or belly (abdomen). °· You pass large clots or tissue from your vagina. °· Your bleeding increases. °· You feel light-headed or weak, or you have fainting  episodes. °· You have chills. °· You are leaking fluid or have a gush of fluid from your vagina. °· You pass out while having a bowel movement. °MAKE SURE YOU: °· Understand these instructions. °· Will watch your condition. °· Will get help right away if you are not doing well or get worse. °Document Released: 10/07/2004 Document Revised: 01/02/2013 Document Reviewed: 09/04/2012 °ExitCare® Patient Information ©2015 [employer]. This information is not intended to replace advice given to you by your health care provider. Make sure you discuss any questions you have with your health care provider. ° °

## 2013-08-01 NOTE — MAU Note (Signed)
Patient states she started having vaginal bleeding and abdominal cramping yesterday. [facility] and an ultrasound indicated no heart beat and patient was sent to MAU for further evaluation.

## 2013-08-02 DIAGNOSIS — Z3481 Encounter for supervision of other normal pregnancy, first trimester: Secondary | ICD-10-CM | POA: Insufficient documentation

## 2013-08-02 DIAGNOSIS — N898 Other specified noninflammatory disorders of vagina: Secondary | ICD-10-CM | POA: Insufficient documentation

## 2013-08-02 NOTE — MAU Provider Note (Signed)
Attestation of Attending Supervision of Advanced Practitioner (PA/CNM/NP): Evaluation and management procedures were performed by the Advanced Practitioner under my supervision and collaboration.  I have reviewed the Advanced Practitioner's note and chart, and I agree with the management and plan.  Dereka Lueras, MD, FACOG Attending Obstetrician & Gynecologist Faculty Practice, [facility]   

## 2013-08-06 ENCOUNTER — Ambulatory Visit (HOSPITAL_COMMUNITY)
Admission: RE | Admit: 2013-08-06 | Discharge: 2013-08-06 | Disposition: A | Discharge status: Home / Self Care | Payer: BC Managed Care – PPO | Source: Ambulatory Visit | Attending: Gynecology | Admitting: Gynecology

## 2013-08-06 ENCOUNTER — Inpatient Hospital Stay (HOSPITAL_COMMUNITY)
Admission: AD | Admit: 2013-08-06 | Discharge: 2013-08-06 | Disposition: A | Discharge status: Home / Self Care | Payer: BC Managed Care – PPO | Source: Ambulatory Visit | Attending: Obstetrics & Gynecology | Admitting: Obstetrics & Gynecology

## 2013-08-06 DIAGNOSIS — O039 Complete or unspecified spontaneous abortion without complication: Secondary | ICD-10-CM

## 2013-08-06 DIAGNOSIS — O368390 Maternal care for abnormalities of the fetal heart rate or rhythm, unspecified trimester, not applicable or unspecified: Secondary | ICD-10-CM | POA: Insufficient documentation

## 2013-08-06 MED ORDER — PROMETHAZINE HCL 25 MG PO TABS: 25.0000 mg | ORAL_TABLET | Freq: Four times a day (QID) | ORAL | Status: DC | PRN

## 2013-08-06 MED ORDER — HYDROCODONE-ACETAMINOPHEN 5-325 MG PO TABS: 2.0000 | ORAL_TABLET | ORAL | Status: DC | PRN

## 2013-08-06 NOTE — Discharge Instructions (Signed)
Incomplete Miscarriage A miscarriage is the sudden loss of an unborn baby (fetus) before the 20th week of pregnancy. In an incomplete miscarriage, parts of the fetus or placenta (afterbirth) remain in the body.  Having a miscarriage can be an emotional experience. Talk with your health care provider about any questions you may have about miscarrying, the grieving process, and your future pregnancy plans. CAUSES   Problems with the fetal chromosomes that make it impossible for the baby to develop normally. Problems with the baby's genes or chromosomes are most often the result of errors that occur by chance as the embryo divides and grows. The problems are not inherited from the parents.  Infection of the cervix or uterus.  Hormone problems.  Problems with the cervix, such as having an incompetent cervix. This is when the tissue in the cervix is not strong enough to hold the pregnancy.  Problems with the uterus, such as an abnormally shaped uterus, uterine fibroids, or congenital abnormalities.  Certain medical conditions.  Smoking, drinking alcohol, or taking illegal drugs.  Trauma. SYMPTOMS   Vaginal bleeding or spotting, with or without cramps or pain.  Pain or cramping in the abdomen or lower back.  Passing fluid, tissue, or blood clots from the vagina. DIAGNOSIS  Your health care provider will perform a physical exam. You may also have an ultrasound to confirm the miscarriage. Blood or urine tests may also be ordered. TREATMENT   Usually, a dilation and curettage (D&C) procedure is performed. During a D&C procedure, the cervix is widened (dilated) and any remaining fetal or placental tissue is gently removed from the uterus.  Antibiotic medicines are prescribed if there is an infection. Other medicines may be given to reduce the size of the uterus (contract) if there is a lot of bleeding.  If you have Rh negative blood and your baby was Rh positive, you will need a Rho (D)  immune globulin shot. This shot will protect any future baby from having Rh blood problems in future pregnancies.  You may be confined to bed rest. This means you should stay in bed and only get up to use the bathroom. HOME CARE INSTRUCTIONS   Rest as directed by your health care provider.  Restrict activity as directed by your health care provider. You may be allowed to continue light activity if curettage was not done but you require further treatment.  Keep track of the number of pads you use each day. Keep track of how soaked (saturated) they are. Record this information.  Do not  use tampons.  Do not douche or have sexual intercourse until approved by your health care provider.  Keep all follow-up appointments for reevaluation and continuing management.  Only take over-the-counter or prescription medicines for pain, fever, or discomfort as directed by your health care provider.  Take antibiotic medicine as directed by your health care provider. Make sure you finish it even if you start to feel better. SEEK IMMEDIATE MEDICAL CARE IF:   You experience severe cramps in your stomach, back, or abdomen.  You have an unexplained temperature (make sure to record these temperatures).  You pass large clots or tissue (save these for your health care provider to inspect).  Your bleeding increases.  You become light-headed, weak, or have fainting episodes. MAKE SURE YOU:   Understand these instructions.  Will watch your condition.  Will get help right away if you are not doing well or get worse. Document Released: 12/28/2004 Document Revised: 05/14/2013 Document Reviewed:   07/27/2012 ExitCare Patient Information 2015 [employer]. This information is not intended to replace advice given to you by your health care provider. Make sure you discuss any questions you have with your health care provider.  

## 2013-08-06 NOTE — MAU Provider Note (Signed)
History     CSN: [id number]  Arrival date and time: 08/06/13 4098   First Provider Initiated Contact with Patient 08/06/13 0848      Chief Complaint  Patient presents with  . Follow-up   HPI Comments: Taylor Simon 33 y.o. [id number] [redacted]w[redacted]d presents to MAU for follow up after ultrasound today which indicates no cardiac activity. Her last ultrasound showed no cardiac activity from 7/22. She is having some bleeding and pain and is taking her Norco which is making her nauseated.     Past Medical History  Diagnosis Date  . Ovarian cyst     Past Surgical History  Procedure Laterality Date  . Wisdom tooth extraction      Family History  Problem Relation Age of Onset  . Cancer Mother 20    Breast    History  Substance Use Topics  . Smoking status: Never Smoker   . Smokeless tobacco: Never Used  . Alcohol Use: Yes     Comment: occassion    Allergies: No Known Allergies  No prescriptions prior to admission    Review of Systems  Constitutional: Negative.   Cardiovascular: Negative.   Gastrointestinal: Positive for nausea and abdominal pain.  Genitourinary:       Vaginal bleeding  Musculoskeletal: Negative.   Skin: Negative.   Neurological: Negative.   Psychiatric/Behavioral: Negative.    Physical Exam   Last menstrual period 06/13/2013.  Physical Exam  Constitutional: She is oriented to person, place, and time. She appears well-developed and well-nourished. No distress.  HENT:  Head: Normocephalic and atraumatic.  Eyes: Pupils are equal, round, and reactive to light.  Genitourinary:  Not examined/ pt in triage   Neurological: She is alert and oriented to person, place, and time.  Skin: Skin is warm and dry.  Psychiatric: She has a normal mood and affect. Her behavior is normal. Judgment and thought content normal.   US Ob Comp Less 14 Wks  08/06/2013   CLINICAL DATA:  Followup suspected missed abortion  EXAM: OBSTETRIC <14 WK ULTRASOUND  TECHNIQUE:  Transabdominal ultrasound was performed for evaluation of the gestation as well as the maternal uterus and adnexal regions.  COMPARISON:  None.  FINDINGS: Intrauterine gestational sac: Single  Yolk sac:  Yes  Embryo:  Yes  Cardiac Activity: No  Heart Rate: None bpm  CRL:   3.6  mm   6 w 1 d                  [place] EDC: 03/31/2014  Maternal uterus/adnexae:  Subchorionic hemorrhage: None  Right ovary: Normal  Left ovary: Normal  Other :Debris is identified floating within the gestational sac.  Free fluid:  None  IMPRESSION: 1. Followup scan from 08/01/2013 again demonstrates findings suspicious for failed pregnancy. Recommend follow-up US in 10-14 (from 08/01/2013) for definitive diagnosis. This recommendation follows SRU consensus guidelines: Diagnostic Criteria for Nonviable Pregnancy Early in the First Trimester. Malva Limes Med 2013; 295:6213-08.   Electronically Signed   By: Signa Kell M.D.   On: 08/06/2013 08:16     MAU Course  Procedures  MDM Spoke with Dr Debroah Loop who did not feel it was necessary to repeat U/S in 10-14 days Pt given option of passing fetus naturally, cytotec, or D&C   Assessment and Plan   A: SAB  P: Pt opted to allow the fetus to pass naturally Refill Norco and add phenergan for nausea Follow up at [place] office  Return to MAU if pain/  bleeding unmanageable     Carolynn ServeBarefoot, Brax Walen Miller 08/06/2013, 11:08 AM

## 2013-08-07 ENCOUNTER — Ambulatory Visit (HOSPITAL_COMMUNITY): Payer: BC Managed Care – PPO

## 2013-08-08 ENCOUNTER — Other Ambulatory Visit: Payer: BC Managed Care – PPO

## 2013-08-10 ENCOUNTER — Other Ambulatory Visit (INDEPENDENT_AMBULATORY_CARE_PROVIDER_SITE_OTHER): Payer: BC Managed Care – PPO

## 2013-08-10 DIAGNOSIS — O039 Complete or unspecified spontaneous abortion without complication: Secondary | ICD-10-CM | POA: Diagnosis not present

## 2013-08-11 LAB — HCG, QUANTITATIVE, PREGNANCY: hCG, Beta Chain, Quant, S: 3128.8 m[IU]/mL

## 2013-08-13 ENCOUNTER — Telehealth: Payer: Self-pay | Admitting: *Deleted

## 2013-08-13 NOTE — Telephone Encounter (Signed)
Pt notified of decreasing BHCG and will follow weekly for lab draws until neg.

## 2013-08-13 NOTE — Telephone Encounter (Signed)
Pt notified of decreasing BHCG and will follow weekly until neg.

## 2013-08-17 ENCOUNTER — Other Ambulatory Visit: Payer: BC Managed Care – PPO

## 2013-08-17 DIAGNOSIS — O039 Complete or unspecified spontaneous abortion without complication: Secondary | ICD-10-CM

## 2013-08-18 LAB — HCG, QUANTITATIVE, PREGNANCY: hCG, Beta Chain, Quant, S: 236.6 m[IU]/mL

## 2013-08-20 ENCOUNTER — Telehealth: Payer: Self-pay | Admitting: *Deleted

## 2013-08-20 NOTE — Telephone Encounter (Signed)
LM on voicemail with labs results

## 2013-08-30 ENCOUNTER — Encounter: Payer: BC Managed Care – PPO | Admitting: Obstetrics & Gynecology

## 2013-08-31 ENCOUNTER — Other Ambulatory Visit (INDEPENDENT_AMBULATORY_CARE_PROVIDER_SITE_OTHER): Payer: BC Managed Care – PPO | Admitting: *Deleted

## 2013-08-31 DIAGNOSIS — O039 Complete or unspecified spontaneous abortion without complication: Secondary | ICD-10-CM

## 2013-09-01 LAB — HCG, QUANTITATIVE, PREGNANCY: hCG, Beta Chain, Quant, S: 6.2 m[IU]/mL

## 2013-09-03 ENCOUNTER — Telehealth: Payer: Self-pay | Admitting: *Deleted

## 2013-09-03 NOTE — Telephone Encounter (Signed)
Lm on cell voicemail of HCG results.

## 2013-10-08 ENCOUNTER — Ambulatory Visit: Payer: BC Managed Care – PPO | Admitting: Physician Assistant

## 2013-10-09 ENCOUNTER — Encounter: Payer: Self-pay | Admitting: Family Medicine

## 2013-10-09 ENCOUNTER — Ambulatory Visit (INDEPENDENT_AMBULATORY_CARE_PROVIDER_SITE_OTHER): Payer: BC Managed Care – PPO | Admitting: Family Medicine

## 2013-10-09 VITALS — BP 109/72 | HR 92 | Temp 97.7°F | Wt 108.0 lb

## 2013-10-09 DIAGNOSIS — J018 Other acute sinusitis: Secondary | ICD-10-CM

## 2013-10-09 MED ORDER — AMOXICILLIN-POT CLAVULANATE 875-125 MG PO TABS: 1.0000 | ORAL_TABLET | Freq: Two times a day (BID) | ORAL | Status: DC

## 2013-10-09 NOTE — Progress Notes (Signed)
   Subjective:    Patient ID: Taylor Simon, female    DOB: [birth year], 33 y.o.   MRN: [id number]  Sinusitis          Sinusitis x 2 wks she has been using OTC meds not working coughing up Green mucus  Maybe low grade fever. No chills. + chills, + HA. + sinus pressure. No GI sxs.  No rash. No ear pain.      Review of Systems     Objective:   Physical Exam  Constitutional: She is oriented to person, place, and time. She appears well-developed and well-nourished.  HENT:  Head: Normocephalic and atraumatic.  Right Ear: External ear normal.  Left Ear: External ear normal.  Nose: Nose normal.  Mouth/Throat: Oropharynx is clear and moist.  TMs and canals are clear. Tender over the maxillary sinuses.   Eyes: Conjunctivae and EOM are normal. Pupils are equal, round, and reactive to light.  Neck: Neck supple. No thyromegaly present.  Cardiovascular: Normal rate, regular rhythm and normal heart sounds.   Pulmonary/Chest: Effort normal and breath sounds normal. She has no wheezes.  Lymphadenopathy:    She has no cervical adenopathy.  Neurological: She is alert and oriented to person, place, and time.  Skin: Skin is warm and dry.  Psychiatric: She has a normal mood and affect.          Assessment & Plan:  Acute bilateral maxillary sinusitis x2 weeks not improving resolving on its own. When she with Augmentin. Okay to continue symptomatic care. Cough not better in one week or suddenly gets worse.

## 2013-10-09 NOTE — Patient Instructions (Signed)

## 2013-10-12 ENCOUNTER — Ambulatory Visit: Payer: BC Managed Care – PPO | Admitting: Physician Assistant

## 2013-11-05 ENCOUNTER — Ambulatory Visit (INDEPENDENT_AMBULATORY_CARE_PROVIDER_SITE_OTHER): Payer: BC Managed Care – PPO | Admitting: Physician Assistant

## 2013-11-05 ENCOUNTER — Encounter: Payer: Self-pay | Admitting: Physician Assistant

## 2013-11-05 VITALS — BP 107/72 | HR 78 | Temp 98.1°F | Ht 64.0 in | Wt 109.0 lb

## 2013-11-05 DIAGNOSIS — L298 Other pruritus: Secondary | ICD-10-CM | POA: Diagnosis not present

## 2013-11-05 DIAGNOSIS — L309 Dermatitis, unspecified: Secondary | ICD-10-CM

## 2013-11-05 DIAGNOSIS — N898 Other specified noninflammatory disorders of vagina: Secondary | ICD-10-CM | POA: Diagnosis not present

## 2013-11-05 DIAGNOSIS — N949 Unspecified condition associated with female genital organs and menstrual cycle: Secondary | ICD-10-CM | POA: Diagnosis not present

## 2013-11-05 DIAGNOSIS — N7689 Other specified inflammation of vagina and vulva: Secondary | ICD-10-CM

## 2013-11-05 LAB — POCT URINALYSIS DIPSTICK
BILIRUBIN UA: NEGATIVE
Blood, UA: NEGATIVE
Glucose, UA: NEGATIVE
Ketones, UA: NEGATIVE
Nitrite, UA: NEGATIVE
Protein, UA: NEGATIVE
Spec Grav, UA: 1.01
Urobilinogen, UA: 0.2
pH, UA: 7

## 2013-11-05 NOTE — Progress Notes (Signed)
   Subjective:    Patient ID: Taylor Simon, female    DOB: 12/27/1980, 33 y.o.   MRN: [id number]  HPI Pt is a 33 yo female who presents to the clinic with vaginal itching, burning and discharge off and on since July. She had a miscarriage in July and has not felt the same since. Denies any sexual activity since July. She takes probiotic daily. She has symptoms and has treated off and on with monistat which as helped. She feels "irritated down there". Describes discharge as white. She has not been to doctor for this condition. Monthly regular periods.      Review of Systems  All other systems reviewed and are negative.      Objective:   Physical Exam  Constitutional: She appears well-developed and well-nourished.  Abdominal: Soft. Bowel sounds are normal. She exhibits no distension and no mass. There is no tenderness. There is no rebound and no guarding.  Genitourinary:  Labia is erythematous and a little swollen.  No active vaginal discharge.  No lesions or masses.  Internal exam was not painful.   Skin: Skin is dry.  Psychiatric: She has a normal mood and affect. Her behavior is normal.          Assessment & Plan:  Vaginal vulvitis/vaginal discharge/irritation- .Marland Kitchen. Results for orders placed in visit on 11/05/13  WET PREP FOR TRICH, YEAST, CLUE      Result Value Ref Range   Yeast Wet Prep HPF POC NONE SEEN  NONE SEEN   Trich, Wet Prep NONE SEEN  NONE SEEN   Clue Cells Wet Prep HPF POC NONE SEEN  NONE SEEN   WBC, Wet Prep HPF POC FEW  NONE SEEN  POCT URINALYSIS DIPSTICK      Result Value Ref Range   Color, UA yellow     Clarity, UA clear     Glucose, UA neg     Bilirubin, UA neg     Ketones, UA neg     Spec Grav, UA 1.010     Blood, UA neg     pH, UA 7.0     Protein, UA neg     Urobilinogen, UA 0.2     Nitrite, UA neg     Leukocytes, UA Trace     Treated with triamcinolone external only.  See if any improvement in symptoms in next week.  STD were tested in July  and negative and has not been sexually active since.

## 2013-11-05 NOTE — Patient Instructions (Signed)
Will send over cream for vaginal irritation and call with results.

## 2013-11-06 ENCOUNTER — Other Ambulatory Visit: Payer: Self-pay | Admitting: Physician Assistant

## 2013-11-06 LAB — WET PREP FOR TRICH, YEAST, CLUE
Clue Cells Wet Prep HPF POC: NONE SEEN
TRICH WET PREP: NONE SEEN
Yeast Wet Prep HPF POC: NONE SEEN

## 2013-11-06 MED ORDER — TRIAMCINOLONE ACETONIDE 0.1 % EX CREA: 1.0000 "application " | TOPICAL_CREAM | Freq: Two times a day (BID) | CUTANEOUS | Status: DC

## 2013-11-12 ENCOUNTER — Encounter: Payer: Self-pay | Admitting: Physician Assistant

## 2014-01-23 ENCOUNTER — Encounter: Payer: Self-pay | Admitting: Physician Assistant

## 2014-01-23 ENCOUNTER — Ambulatory Visit (INDEPENDENT_AMBULATORY_CARE_PROVIDER_SITE_OTHER): Payer: BLUE CROSS/BLUE SHIELD | Admitting: Physician Assistant

## 2014-01-23 VITALS — BP 111/74 | HR 84 | Temp 97.4°F | Ht 64.0 in | Wt 109.0 lb

## 2014-01-23 DIAGNOSIS — J209 Acute bronchitis, unspecified: Secondary | ICD-10-CM

## 2014-01-23 MED ORDER — AZITHROMYCIN 250 MG PO TABS: ORAL_TABLET | ORAL | Status: DC

## 2014-01-23 MED ORDER — HYDROCODONE-HOMATROPINE 5-1.5 MG/5ML PO SYRP: 5.0000 mL | ORAL_SOLUTION | Freq: Every evening | ORAL | Status: DC | PRN

## 2014-01-23 NOTE — Patient Instructions (Signed)

## 2014-01-23 NOTE — Progress Notes (Signed)
   Subjective:    Patient ID: Taylor Simon, female    DOB: June 22, 1980, 34 y.o.   MRN: [id number]  HPI   Pt presents to the clinic with cough for 2 weeks that is productive. No wheezing but chest feels tight and some short of breath. She has been taking mucinex twice a day and helped orginally but not is not helping. Cough worse at bedtime. No sinus pressure, ear pain, ST, nasal congestion. No fever or chills.    Review of Systems  All other systems reviewed and are negative.      Objective:   Physical Exam  Constitutional: She is oriented to person, place, and time. She appears well-developed and well-nourished.  HENT:  Head: Normocephalic and atraumatic.  Right Ear: External ear normal.  Left Ear: External ear normal.  Nose: Nose normal.  Mouth/Throat: Oropharynx is clear and moist.  Eyes: Conjunctivae are normal. Right eye exhibits no discharge. Left eye exhibits no discharge.  Neck: Normal range of motion. Neck supple.  Cardiovascular: Normal rate, regular rhythm and normal heart sounds.   Pulmonary/Chest: Effort normal and breath sounds normal. She has no wheezes.  Lymphadenopathy:    She has no cervical adenopathy.  Neurological: She is alert and oriented to person, place, and time.  Skin: Skin is dry.  Psychiatric: She has a normal mood and affect. Her behavior is normal.          Assessment & Plan:  Acute bronchitis- treated with zpak for 5 days. Symptomatic care given. Hycodan for cough at bedtime. Follow up if not improving or if symptoms worsening.

## 2014-02-04 ENCOUNTER — Ambulatory Visit (INDEPENDENT_AMBULATORY_CARE_PROVIDER_SITE_OTHER): Payer: BLUE CROSS/BLUE SHIELD | Admitting: Physician Assistant

## 2014-02-04 ENCOUNTER — Encounter: Payer: Self-pay | Admitting: Physician Assistant

## 2014-02-04 VITALS — BP 101/62 | HR 78 | Wt 111.0 lb

## 2014-02-04 DIAGNOSIS — L0390 Cellulitis, unspecified: Secondary | ICD-10-CM

## 2014-02-04 MED ORDER — DOXYCYCLINE HYCLATE 100 MG PO TABS: 100.0000 mg | ORAL_TABLET | Freq: Two times a day (BID) | ORAL | Status: DC

## 2014-02-04 NOTE — Patient Instructions (Signed)
Keep elevated.  Start doxycycline.  Keep to ice 15 minutes three times a day.  Ibuprofen 600mg  twice day for 3-5 days.

## 2014-02-04 NOTE — Progress Notes (Signed)
   Subjective:    Patient ID: Taylor Simon, female    DOB: 09/17/1980, 34 y.o.   MRN: [id number]  HPI Pt is a 34 yo female who presents to the clinic with her 4th metatarsal swelling, redness and pain of her left foot. Started this morning all of a sudden. Painful. Tried some heat and did not change. Redness seems to be moving up into foot. No trauma. Wearing shoes without pain. No fever, chills, n/v/d. Not tried anything orally to make better.    Review of Systems  All other systems reviewed and are negative.      Objective:   Physical Exam  Constitutional: She is oriented to person, place, and time. She appears well-developed and well-nourished.  Neurological: She is alert and oriented to person, place, and time.  Skin:  Left 4th toe slightly swollen and red with redness moving up into anterior foot. Tenderness to palpation over toe and foot. No lacerations or abrasions.  Good circulation- pedal pulses 2+.   Psychiatric: She has a normal mood and affect. Her behavior is normal.          Assessment & Plan:  Cellulitis- appears like cellulitis despite no visible abrasions. No evidence of trauma. Low suspicion of gout. Sent doxycycline for 10 days. Encouraged ice. Start Ibuprofen 600mg  at least twice a day for next 2 days. Keep foot elevated as much as possible.  If not improving in 48 hours or worsening will get xray, uric acid, cbc, cmp for further evaluation.

## 2014-02-05 DIAGNOSIS — L0390 Cellulitis, unspecified: Secondary | ICD-10-CM | POA: Insufficient documentation

## 2014-03-19 ENCOUNTER — Encounter: Payer: Self-pay | Admitting: Family Medicine

## 2014-03-19 ENCOUNTER — Ambulatory Visit (INDEPENDENT_AMBULATORY_CARE_PROVIDER_SITE_OTHER): Payer: BLUE CROSS/BLUE SHIELD | Admitting: Family Medicine

## 2014-03-19 VITALS — BP 109/76 | HR 78 | Temp 98.0°F | Wt 109.0 lb

## 2014-03-19 DIAGNOSIS — J329 Chronic sinusitis, unspecified: Secondary | ICD-10-CM

## 2014-03-19 MED ORDER — AMOXICILLIN-POT CLAVULANATE 500-125 MG PO TABS: ORAL_TABLET | ORAL | Status: AC

## 2014-03-19 MED ORDER — PREDNISONE 20 MG PO TABS: ORAL_TABLET | ORAL | Status: AC

## 2014-03-19 NOTE — Progress Notes (Signed)
CC: Taylor Simon is a 34 y.o. female is here for Nasal Congestion   Subjective: HPI:  Facial pressure in the forehead mild in severity present for the past 24 hours worsening since onset yesterday. Accompanied by nasal congestion and mild subjective postnasal drip. No cough, wheezing, shortness of breath nor dysphagia. Interventions have included warm compresses to the back of her neck where there is some mild myalgia. Nothing else seems to make symptoms better or worse. They've been persistent since onset. No known sick contacts. Denies fevers, chills, rashes nor headache other than that described above   Review Of Systems Outlined In HPI  Past Medical History  Diagnosis Date  . Ovarian cyst     Past Surgical History  Procedure Laterality Date  . Wisdom tooth extraction     Family History  Problem Relation Age of Onset  . Cancer Mother 5765    Breast    History   Social History  . Marital Status: Divorced    Spouse Name: N/A  . Number of Children: N/A  . Years of Education: N/A   Occupational History  . Not on file.   Social History Main Topics  . Smoking status: Never Smoker   . Smokeless tobacco: Never Used  . Alcohol Use: Yes     Comment: occassion  . Drug Use: No  . Sexual Activity:    Partners: Male   Other Topics Concern  . Not on file   Social History Narrative     Objective: BP 109/76 mmHg  Pulse 78  Temp(Src) 98 F (36.7 C) (Oral)  Wt 109 lb (49.442 kg)  LMP 06/13/2013  General: Alert and Oriented, No Acute Distress HEENT: Pupils equal, round, reactive to light. Conjunctivae clear.  External ears unremarkable, canals clear with intact TMs with appropriate landmarks.  Middle ear appears open without effusion. Pink inferior turbinates.  Moist mucous membranes, pharynx without inflammation nor lesions.  Neck supple without palpable lymphadenopathy nor abnormal masses. Lungs: Clear to auscultation bilaterally, no wheezing/ronchi/rales.  Comfortable work  of breathing. Good air movement. Extremities: No peripheral edema.  Strong peripheral pulses.  Mental Status: No depression, anxiety, nor agitation. Skin: Warm and dry.  Assessment & Plan: Taylor Simon was seen today for nasal congestion.  Diagnoses and all orders for this visit:  Sinusitis, unspecified chronicity, unspecified location Orders: -     predniSONE (DELTASONE) 20 MG tablet; Three tabs at once daily for five days.  Other orders -     amoxicillin-clavulanate (AUGMENTIN) 500-125 MG per tablet; Take one by mouth every 8 hours for ten total days.   Sinusitis most likely viral, begin prednisone and if no better by Thursday or Friday or if worsening with then be wise to start Augmentin. Start nasal saline washes consider Alka-Seltzer cold and sinus  Return if symptoms worsen or fail to improve.

## 2014-03-20 ENCOUNTER — Telehealth: Payer: Self-pay | Admitting: *Deleted

## 2014-03-20 NOTE — Telephone Encounter (Signed)
Pt.notified

## 2014-03-20 NOTE — Telephone Encounter (Signed)
Prednisone can cause a great deal of side effects and this could be due to the predisone but does not sound like anything threatening her health.  I would just recommend decreasing her dose to only 40mg  daily for a total of 5 days.

## 2014-03-20 NOTE — Telephone Encounter (Signed)
Left message on patient to call back regarding message she left. Rhonda Cunningham,CMA

## 2014-03-20 NOTE — Telephone Encounter (Signed)
Pt states she took one dose of prednisone last pm and this am she woke up with pressure on " the back of arms and wrist."  In her message she states she does not see any swelling. She wants to know if this is a side effect of the medication

## 2014-07-02 ENCOUNTER — Ambulatory Visit (INDEPENDENT_AMBULATORY_CARE_PROVIDER_SITE_OTHER): Payer: BLUE CROSS/BLUE SHIELD

## 2014-07-02 ENCOUNTER — Ambulatory Visit (INDEPENDENT_AMBULATORY_CARE_PROVIDER_SITE_OTHER): Payer: BLUE CROSS/BLUE SHIELD | Admitting: Family Medicine

## 2014-07-02 ENCOUNTER — Encounter: Payer: Self-pay | Admitting: Family Medicine

## 2014-07-02 VITALS — BP 110/67 | HR 81 | Wt 110.0 lb

## 2014-07-02 DIAGNOSIS — M25572 Pain in left ankle and joints of left foot: Secondary | ICD-10-CM

## 2014-07-02 NOTE — Patient Instructions (Signed)
Use the ankle immobilizer on a daily basis for at least the next 2 weeks. After this time start using the ankle exercises that are attached on a daily basis for another 2 weeks. As long as pain is not returning it would be safe to start a stationary cycling or elliptical routine if you're looking to exercise. If symptoms are not any better after 2 weeks request an appointment with our sports medicine physician for further evaluation.

## 2014-07-02 NOTE — Progress Notes (Signed)
CC: Taylor Simon is a 34 y.o. female is here for Ankle Pain   Subjective: HPI:  Left ankle pain localized in the lateral aspect of the anchor that radiates into the back of the ankle that has been present for the past 2 weeks. Symptoms began soon after she began a new boot camp workout program. This program involved frequent stops and starts. Pain slowly developed one day that she was not working out and she denies any abrupt onset of pain. It seems to throb if she is resting and nonweightbearing it seems to be persistent once weightbearing. No particular movement seems to make it wet or worse other than weightbearing makes it worse. No interventions as of yet other than cutting back on exercising. She denies any overlying skin changes other than swelling that slightly has improved since onset 2 weeks ago. Denies joint pain elsewhere. Denies fevers, chills or rapid heart rate   Review Of Systems Outlined In HPI  Past Medical History  Diagnosis Date  . Ovarian cyst     Past Surgical History  Procedure Laterality Date  . Wisdom tooth extraction     Family History  Problem Relation Age of Onset  . Cancer Mother 36    Breast    History   Social History  . Marital Status: Divorced    Spouse Name: N/A  . Number of Children: N/A  . Years of Education: N/A   Occupational History  . Not on file.   Social History Main Topics  . Smoking status: Never Smoker   . Smokeless tobacco: Never Used  . Alcohol Use: Yes     Comment: occassion  . Drug Use: No  . Sexual Activity:    Partners: Male   Other Topics Concern  . Not on file   Social History Narrative     Objective: BP 110/67 mmHg  Pulse 81  Wt 110 lb (49.896 kg)  LMP 06/18/2014  Vital signs reviewed. General: Alert and Oriented, No Acute Distress HEENT: Pupils equal, round, reactive to light. Conjunctivae clear.  External ears unremarkable.  Moist mucous membranes. Lungs: Clear and comfortable work of breathing, speaking  in full sentences without accessory muscle use. Cardiac: Regular rate and rhythm.  Neuro: CN II-XII grossly intact, gait normal. Extremities: No peripheral edema.  Strong peripheral pulses. Full range of motion and strength of the left ankle. No pain at the medial malleoli, no pain at the base of the fifth metatarsal. Pain is reproduced with palpation of any aspect of the inferior aspect of the lateral malleoli. There is mild swelling just below and posterior to the lateral malleoli but no other skin changes. Anterior drawer is negative.  Mental Status: No depression, anxiety, nor agitation. Logical though process. Skin: Warm and dry.  Assessment & Plan: Taylor Simon was seen today for ankle pain.  Diagnoses and all orders for this visit:  Left ankle pain Orders: -     DG Ankle Complete Left; Future   Left ankle pain: X-ray was obtained to rule out avulsion. Fortunately no fracture or signs of avulsion were present. I suspect she she has developed some tendinitis in the 3 ligaments below her lateral malleoli and possibly even a mild tear. Immobilization for 2 weeks and then start rehabilitation exercises. She politely declines any anti-inflammatory  Return if symptoms worsen or fail to improve.

## 2014-11-11 ENCOUNTER — Ambulatory Visit (INDEPENDENT_AMBULATORY_CARE_PROVIDER_SITE_OTHER): Payer: BLUE CROSS/BLUE SHIELD | Admitting: Physician Assistant

## 2014-11-11 ENCOUNTER — Encounter: Payer: Self-pay | Admitting: Physician Assistant

## 2014-11-11 VITALS — BP 117/73 | HR 72 | Temp 98.4°F | Ht 64.0 in | Wt 111.0 lb

## 2014-11-11 DIAGNOSIS — J020 Streptococcal pharyngitis: Secondary | ICD-10-CM | POA: Diagnosis not present

## 2014-11-11 DIAGNOSIS — Z2089 Contact with and (suspected) exposure to other communicable diseases: Secondary | ICD-10-CM

## 2014-11-11 DIAGNOSIS — Z20818 Contact with and (suspected) exposure to other bacterial communicable diseases: Secondary | ICD-10-CM

## 2014-11-11 LAB — POCT RAPID STREP A (OFFICE): Rapid Strep A Screen: NEGATIVE

## 2014-11-11 MED ORDER — AMOXICILLIN 875 MG PO TABS: 875.0000 mg | ORAL_TABLET | Freq: Two times a day (BID) | ORAL | Status: DC

## 2014-11-11 NOTE — Progress Notes (Signed)
   Subjective:    Patient ID: Taylor Simon, female    DOB: [birth year], 34 y.o.   MRN: [id number]  HPI Patient is a 34 year old female who presents to the clinic with a sore throat for 3 days. She has tried zinc and throat lozengers. Her 2 boys have been diagnosed with strep throat and positive rapid strep in the last 2 days. She denies any ear pain, sinus pressure, cough, chest congestion, fever or chills. She rates sore throat for out of 10. She does feel like it is getting progressively worse.   Review of Systems  All other systems reviewed and are negative.      Objective:   Physical Exam  Constitutional: She appears well-developed and well-nourished.  HENT:  Head: Normocephalic and atraumatic.  Right Ear: External ear normal.  Left Ear: External ear normal.  Nose: Nose normal.  Mouth/Throat: No oropharyngeal exudate.  TMs clear bilaterally.  Oropharynx erythematous with mildly enlarged tonsils without exudate.  Negative for maxillary or frontal sinus tenderness to palpation.  Eyes: Conjunctivae are normal. Right eye exhibits no discharge. Left eye exhibits no discharge.  Neck: Normal range of motion. Neck supple.  Bilateral anterior cervical tender adenopathy.  Cardiovascular: Normal rate, regular rhythm and normal heart sounds.   Pulmonary/Chest: Effort normal and breath sounds normal.  Lymphadenopathy:    She has cervical adenopathy.  Psychiatric: She has a normal mood and affect. Her behavior is normal.          Assessment & Plan:  Streptococcal sore throat-rapid strep was negative. I still think with exposure and only a sore throat and no other constitutional symptoms that this is strep. Discussed symptomatic treatment for the next 24 hours. If not improving Treated with amoxicillin for 10 days.

## 2014-11-11 NOTE — Patient Instructions (Signed)

## 2015-02-24 ENCOUNTER — Encounter: Payer: Self-pay | Admitting: Physician Assistant

## 2015-02-24 ENCOUNTER — Ambulatory Visit (INDEPENDENT_AMBULATORY_CARE_PROVIDER_SITE_OTHER): Payer: BLUE CROSS/BLUE SHIELD | Admitting: Physician Assistant

## 2015-02-24 VITALS — BP 93/56 | HR 69 | Temp 98.0°F | Ht 64.0 in | Wt 110.0 lb

## 2015-02-24 DIAGNOSIS — J029 Acute pharyngitis, unspecified: Secondary | ICD-10-CM

## 2015-02-24 DIAGNOSIS — Z2089 Contact with and (suspected) exposure to other communicable diseases: Secondary | ICD-10-CM | POA: Diagnosis not present

## 2015-02-24 DIAGNOSIS — Z20818 Contact with and (suspected) exposure to other bacterial communicable diseases: Secondary | ICD-10-CM

## 2015-02-24 LAB — POCT RAPID STREP A (OFFICE): Rapid Strep A Screen: NEGATIVE

## 2015-02-24 MED ORDER — AMOXICILLIN-POT CLAVULANATE 875-125 MG PO TABS: 1.0000 | ORAL_TABLET | Freq: Two times a day (BID) | ORAL | Status: DC

## 2015-02-24 NOTE — Patient Instructions (Signed)

## 2015-02-24 NOTE — Progress Notes (Addendum)
   Subjective:    Patient ID: Taylor Simon, female    DOB: May 22, 1980, 35 y.o.   MRN: [id number]  Sore Throat    Patient is a 35 year old female presenting with "sore throat" since 02/22/2015. Patient states that her pain is constant throughout the day and she has associated nausea, headache, fever, non-productive cough and malaise. Patient's son was recently diagnosed with streptococcal pharyngitis. Patient denies rhinorrhea, congestion, diarrhea, and constipation. Same symptoms as last fall and resolved with in 24 hours of amoxicillin.   Review of Systems   Please see HPI Objective:   Physical Exam  Constitutional: She is oriented to person, place, and time. She appears well-developed and well-nourished. No distress.  HENT:  Head: Normocephalic and atraumatic.  Nose: Nose normal.  Patient's tympanic membranes are pearly with bony landmarks visible and without erythema bilaterally.   Patient has tender anterior cervical lymphadenopathy.  Patient's tonsils are erythematous without tonsillar exudate or petechia.   Eyes: Conjunctivae and EOM are normal. Pupils are equal, round, and reactive to light. Right eye exhibits no discharge. Left eye exhibits no discharge.  Neck: Normal range of motion. Neck supple. No thyromegaly present.  Cardiovascular: Normal rate, regular rhythm and normal heart sounds.   Pulmonary/Chest: Effort normal and breath sounds normal. No respiratory distress. She has no wheezes. She has no rales.  Abdominal: Soft. She exhibits no distension. There is no tenderness. There is no rebound and no guarding.  Musculoskeletal: Normal range of motion.  Neurological: She is alert and oriented to person, place, and time.  Skin: Skin is warm and dry. She is not diaphoretic.  Psychiatric: She has a normal mood and affect. Her behavior is normal. Judgment and thought content normal.      Assessment & Plan:  1. Streptococcal pharyngitis  Patient has symptoms consistent with  streptococcal pharyngitis. Patient's rapid strep in office test was negative. Patient was treated empirically with Augmentin. Patient was advised to use salt water gargles to help with throat pain.

## 2015-02-26 ENCOUNTER — Ambulatory Visit (INDEPENDENT_AMBULATORY_CARE_PROVIDER_SITE_OTHER): Payer: BLUE CROSS/BLUE SHIELD | Admitting: Physician Assistant

## 2015-02-26 ENCOUNTER — Encounter: Payer: Self-pay | Admitting: Physician Assistant

## 2015-02-26 VITALS — BP 93/56 | HR 79 | Temp 98.4°F | Ht 64.0 in | Wt 112.0 lb

## 2015-02-26 DIAGNOSIS — R51 Headache: Secondary | ICD-10-CM | POA: Diagnosis not present

## 2015-02-26 DIAGNOSIS — R509 Fever, unspecified: Secondary | ICD-10-CM | POA: Diagnosis not present

## 2015-02-26 DIAGNOSIS — J069 Acute upper respiratory infection, unspecified: Secondary | ICD-10-CM

## 2015-02-26 DIAGNOSIS — R519 Headache, unspecified: Secondary | ICD-10-CM

## 2015-02-26 DIAGNOSIS — R5383 Other fatigue: Secondary | ICD-10-CM | POA: Diagnosis not present

## 2015-02-26 LAB — POCT INFLUENZA A/B
Influenza A, POC: NEGATIVE
Influenza B, POC: NEGATIVE

## 2015-02-26 NOTE — Progress Notes (Signed)
   Subjective:    Patient ID: Taylor Simon, female    DOB: 05/25/1980, 35 y.o.   MRN: [id number]  HPI  Pt is a 35 yo female who presents to the clinic with fever, chills, body aches that started last night. She was dx with strep throat earlier this week and started augmentin. Her throat feels better. She ran fever or 102 last night. No temp this morning. Not tried anything OTC. She has a dry cough but no wheezing, SOB.     Review of Systems    see HPI.  Objective:   Physical Exam  Constitutional: She is oriented to person, place, and time. She appears well-developed and well-nourished.  HENT:  Head: Normocephalic and atraumatic.  Right Ear: External ear normal.  Left Ear: External ear normal.  Nose: Nose normal.  Mouth/Throat: Oropharynx is clear and moist. No oropharyngeal exudate.  Eyes: Conjunctivae are normal. Right eye exhibits no discharge. Left eye exhibits no discharge.  Neck: Normal range of motion. Neck supple.  Cardiovascular: Normal rate, regular rhythm and normal heart sounds.   Pulmonary/Chest: Effort normal and breath sounds normal. She has no wheezes.  Lymphadenopathy:    She has no cervical adenopathy.  Neurological: She is alert and oriented to person, place, and time.  Psychiatric: She has a normal mood and affect. Her behavior is normal.          Assessment & Plan:  URI- influenza negative in office. Discussed this is viral in etiology and seems flu-like. Discussed ibuprofen, rest, hydrate, delsym/honey for cough. Stay away from son and husband until fever free for 24 hours.

## 2015-02-26 NOTE — Patient Instructions (Signed)
Upper Respiratory Infection, Adult Most upper respiratory infections (URIs) are a viral infection of the air passages leading to the lungs. A URI affects the nose, throat, and upper air passages. The most common type of URI is nasopharyngitis and is typically referred to as "the common cold." URIs run their course and usually go away on their own. Most of the time, a URI does not require medical attention, but sometimes a bacterial infection in the upper airways can follow a viral infection. This is called a secondary infection. Sinus and middle ear infections are common types of secondary upper respiratory infections. Bacterial pneumonia can also complicate a URI. A URI can worsen asthma and chronic obstructive pulmonary disease (COPD). Sometimes, these complications can require emergency medical care and may be life threatening.  CAUSES Almost all URIs are caused by viruses. A virus is a type of germ and can spread from one person to another.  RISKS FACTORS You may be at risk for a URI if:   You smoke.   You have chronic heart or lung disease.  You have a weakened defense (immune) system.   You are very young or very old.   You have nasal allergies or asthma.  You work in crowded or poorly ventilated areas.  You work in health care facilities or schools. SIGNS AND SYMPTOMS  Symptoms typically develop 2-3 days after you come in contact with a cold virus. Most viral URIs last 7-10 days. However, viral URIs from the influenza virus (flu virus) can last 14-18 days and are typically more severe. Symptoms may include:   Runny or stuffy (congested) nose.   Sneezing.   Cough.   Sore throat.   Headache.   Fatigue.   Fever.   Loss of appetite.   Pain in your forehead, behind your eyes, and over your cheekbones (sinus pain).  Muscle aches.  DIAGNOSIS  Your health care provider may diagnose a URI by:  Physical exam.  Tests to check that your symptoms are not due to  another condition such as:  Strep throat.  Sinusitis.  Pneumonia.  Asthma. TREATMENT  A URI goes away on its own with time. It cannot be cured with medicines, but medicines may be prescribed or recommended to relieve symptoms. Medicines may help:  Reduce your fever.  Reduce your cough.  Relieve nasal congestion. HOME CARE INSTRUCTIONS   Take medicines only as directed by your health care provider.   Gargle warm saltwater or take cough drops to comfort your throat as directed by your health care provider.  Use a warm mist humidifier or inhale steam from a shower to increase air moisture. This may make it easier to breathe.  Drink enough fluid to keep your urine clear or pale yellow.   Eat soups and other clear broths and maintain good nutrition.   Rest as needed.   Return to work when your temperature has returned to normal or as your health care provider advises. You may need to stay home longer to avoid infecting others. You can also use a face mask and careful hand washing to prevent spread of the virus.  Increase the usage of your inhaler if you have asthma.   Do not use any tobacco products, including cigarettes, chewing tobacco, or electronic cigarettes. If you need help quitting, ask your health care provider. PREVENTION  The best way to protect yourself from getting a cold is to practice good hygiene.   Avoid oral or hand contact with people with cold   symptoms.   Wash your hands often if contact occurs.  There is no clear evidence that vitamin C, vitamin E, echinacea, or exercise reduces the chance of developing a cold. However, it is always recommended to get plenty of rest, exercise, and practice good nutrition.  SEEK MEDICAL CARE IF:   You are getting worse rather than better.   Your symptoms are not controlled by medicine.   You have chills.  You have worsening shortness of breath.  You have brown or red mucus.  You have yellow or brown nasal  discharge.  You have pain in your face, especially when you bend forward.  You have a fever.  You have swollen neck glands.  You have pain while swallowing.  You have white areas in the back of your throat. SEEK IMMEDIATE MEDICAL CARE IF:   You have severe or persistent:  Headache.  Ear pain.  Sinus pain.  Chest pain.  You have chronic lung disease and any of the following:  Wheezing.  Prolonged cough.  Coughing up blood.  A change in your usual mucus.  You have a stiff neck.  You have changes in your:  Vision.  Hearing.  Thinking.  Mood. MAKE SURE YOU:   Understand these instructions.  Will watch your condition.  Will get help right away if you are not doing well or get worse.   This information is not intended to replace advice given to you by your health care provider. Make sure you discuss any questions you have with your health care provider.   Document Released: 06/23/2000 Document Revised: 05/14/2014 Document Reviewed: 04/04/2013 Elsevier Interactive Patient Education 2016 [employer]  

## 2015-11-11 ENCOUNTER — Encounter: Payer: Self-pay | Admitting: Sports Medicine

## 2015-11-11 ENCOUNTER — Ambulatory Visit (INDEPENDENT_AMBULATORY_CARE_PROVIDER_SITE_OTHER): Payer: BLUE CROSS/BLUE SHIELD | Admitting: Sports Medicine

## 2015-11-11 VITALS — BP 118/77 | HR 73 | Resp 16 | Wt 113.2 lb

## 2015-11-11 DIAGNOSIS — Z23 Encounter for immunization: Secondary | ICD-10-CM | POA: Diagnosis not present

## 2015-11-11 DIAGNOSIS — L03011 Cellulitis of right finger: Secondary | ICD-10-CM

## 2015-11-11 MED ORDER — DOXYCYCLINE HYCLATE 100 MG PO TABS: 100.0000 mg | ORAL_TABLET | Freq: Two times a day (BID) | ORAL | 0 refills | Status: DC

## 2015-11-11 NOTE — Addendum Note (Signed)
Addended by: Baird KayUGLAS, Fatim Vanderschaaf M on: 11/11/2015 11:42 AM   Modules accepted: Orders

## 2015-11-11 NOTE — Progress Notes (Signed)
  Subjective:    CC: Finger infection  HPI: This is a pleasant 35 year old female, walking spotting she cut the dorsum of her right second PIP joint, it is minimally swollen. Minimally tender. Unsure as to when she last got her tetanus vaccine.  Past medical history:  Negative.  See flowsheet/record as well for more information.  Surgical history: Negative.  See flowsheet/record as well for more information.  Family history: Negative.  See flowsheet/record as well for more information.  Social history: Negative.  See flowsheet/record as well for more information.  Allergies, and medications have been entered into the medical record, reviewed, and no changes needed.   Review of Systems: No fevers, chills, night sweats, weight loss, chest pain, or shortness of breath.   Objective:    General: Well Developed, well nourished, and in no acute distress.  Neuro: Alert and oriented x3, extra-ocular muscles intact, sensation grossly intact.  HEENT: Normocephalic, atraumatic, pupils equal round reactive to light, neck supple, no masses, no lymphadenopathy, thyroid nonpalpable.  Skin: Warm and dry, no rashes. Cardiac: Regular rate and rhythm, no murmurs rubs or gallops, no lower extremity edema.  Respiratory: Clear to auscultation bilaterally. Not using accessory muscles, speaking in full sentences. Right hand: There is a tiny bit of swelling over the right second PIP, good motion, good strength. No evidence of a joint effusion. Only mild erythema.  Impression and Recommendations:    Cellulitis Right index finger over the PIP joint. Doxycycline, she will use Neosporin and a Band-Aid as well as warm compresses. Tdap today. Return as needed.  I spent 25 minutes with this patient, greater than 50% was face-to-face time counseling regarding the above diagnoses

## 2015-11-11 NOTE — Assessment & Plan Note (Signed)
Right index finger over the PIP joint. Doxycycline, she will use Neosporin and a Band-Aid as well as warm compresses. Tdap today. Return as needed.

## 2015-11-17 ENCOUNTER — Encounter: Payer: Self-pay | Admitting: Sports Medicine

## 2015-11-17 ENCOUNTER — Ambulatory Visit (INDEPENDENT_AMBULATORY_CARE_PROVIDER_SITE_OTHER): Payer: BLUE CROSS/BLUE SHIELD | Admitting: Sports Medicine

## 2015-11-17 DIAGNOSIS — L03011 Cellulitis of right finger: Secondary | ICD-10-CM | POA: Diagnosis not present

## 2015-11-17 MED ORDER — SULFAMETHOXAZOLE-TRIMETHOPRIM 800-160 MG PO TABS: 1.0000 | ORAL_TABLET | Freq: Two times a day (BID) | ORAL | 0 refills | Status: DC

## 2015-11-17 MED ORDER — MELOXICAM 15 MG PO TABS: 15.0000 mg | ORAL_TABLET | Freq: Every day | ORAL | 0 refills | Status: DC

## 2015-11-17 MED ORDER — FLUCONAZOLE 150 MG PO TABS: 300.0000 mg | ORAL_TABLET | Freq: Once | ORAL | 0 refills | Status: AC

## 2015-11-17 NOTE — Progress Notes (Signed)
  Subjective:    CC: Follow-up  HPI: Finger pain: I saw this pleasant 35 year old female week ago with what appeared to be early cellulitis, we treated her with doxycycline, since then she's had some sloughing of a blister and persistent pain. Adams are mild, persistent  Past medical history:  Negative.  See flowsheet/record as well for more information.  Surgical history: Negative.  See flowsheet/record as well for more information.  Family history: Negative.  See flowsheet/record as well for more information.  Social history: Negative.  See flowsheet/record as well for more information.  Allergies, and medications have been entered into the medical record, reviewed, and no changes needed.   Review of Systems: No fevers, chills, night sweats, weight loss, chest pain, or shortness of breath.   Objective:    General: Well Developed, well nourished, and in no acute distress.  Neuro: Alert and oriented x3, extra-ocular muscles intact, sensation grossly intact.  HEENT: Normocephalic, atraumatic, pupils equal round reactive to light, neck supple, no masses, no lymphadenopathy, thyroid nonpalpable.  Skin: Warm and dry, no rashes. Cardiac: Regular rate and rhythm, no murmurs rubs or gallops, no lower extremity edema.  Respiratory: Clear to auscultation bilaterally. Not using accessory muscles, speaking in full sentences. Right hand: Over the index finger there is evidence of a blister that has since unroofed, and there is visible granulation tissue underneath. The wound was debrided, cleaned with peroxide, and dressed with antibiotic ointment and a Band-Aid.  Impression and Recommendations:    Cellulitis Right index finger did form a blister which I debrided today. Switching to Septra. Keep covered with a Band-Aid. Return in one week.  I spent 25 minutes with this patient, greater than 50% was face-to-face time counseling regarding the above diagnoses

## 2015-11-17 NOTE — Assessment & Plan Note (Signed)
Right index finger did form a blister which I debrided today. Switching to Septra. Keep covered with a Band-Aid. Return in one week.

## 2015-11-18 ENCOUNTER — Telehealth: Payer: Self-pay

## 2015-11-18 MED ORDER — DOXYCYCLINE HYCLATE 100 MG PO TABS: 100.0000 mg | ORAL_TABLET | Freq: Two times a day (BID) | ORAL | 0 refills | Status: AC

## 2015-11-18 NOTE — Telephone Encounter (Signed)
Fine, back to doxycycline.

## 2015-11-18 NOTE — Telephone Encounter (Signed)
You seen pt yesterday and Rx her bactrim. She reports since taking the medications she have been experiencing aches and pains in both her legs. Please advise.

## 2015-11-18 NOTE — Telephone Encounter (Signed)
Doesn't sound like an adverse effect, which would be a rash. Have her do some of the meloxicam, and stay active.

## 2015-11-18 NOTE — Telephone Encounter (Signed)
Pt is refusing to take the bactrim.  She stated that she strongly believes that that's the cause of her pain and is requesting something else. Please advise.

## 2015-11-24 ENCOUNTER — Encounter: Payer: Self-pay | Admitting: Sports Medicine

## 2015-11-24 ENCOUNTER — Ambulatory Visit (INDEPENDENT_AMBULATORY_CARE_PROVIDER_SITE_OTHER): Payer: BLUE CROSS/BLUE SHIELD | Admitting: Sports Medicine

## 2015-11-24 DIAGNOSIS — L03011 Cellulitis of right finger: Secondary | ICD-10-CM | POA: Diagnosis not present

## 2015-11-24 NOTE — Progress Notes (Signed)
  Subjective:    CC: Follow-up  HPI: Taylor Simon returns for follow-up of her finger cellulitis, I debrided some devitalized tissue at the last visit and she returns today with good healing. She did ultimately end up taking her Septra as prescribed.  Past medical history:  Negative.  See flowsheet/record as well for more information.  Surgical history: Negative.  See flowsheet/record as well for more information.  Family history: Negative.  See flowsheet/record as well for more information.  Social history: Negative.  See flowsheet/record as well for more information.  Allergies, and medications have been entered into the medical record, reviewed, and no changes needed.   Review of Systems: No fevers, chills, night sweats, weight loss, chest pain, or shortness of breath.   Objective:    General: Well Developed, well nourished, and in no acute distress.  Neuro: Alert and oriented x3, extra-ocular muscles intact, sensation grossly intact.  HEENT: Normocephalic, atraumatic, pupils equal round reactive to light, neck supple, no masses, no lymphadenopathy, thyroid nonpalpable.  Skin: Warm and dry, no rashes. Cardiac: Regular rate and rhythm, no murmurs rubs or gallops, no lower extremity edema.  Respiratory: Clear to auscultation bilaterally. Not using accessory muscles, speaking in full sentences. Right hand: Index finger shows good healing of the skin with completed granulation and epithelialization of the underlying tissue.  Impression and Recommendations:    Cellulitis Resolved after debridement and switching to Septra. Return as needed.

## 2015-11-24 NOTE — Assessment & Plan Note (Signed)
Resolved after debridement and switching to Septra. Return as needed.

## 2016-03-25 ENCOUNTER — Ambulatory Visit (INDEPENDENT_AMBULATORY_CARE_PROVIDER_SITE_OTHER): Payer: BLUE CROSS/BLUE SHIELD | Admitting: Osteopathic Medicine

## 2016-03-25 ENCOUNTER — Encounter: Payer: Self-pay | Admitting: Osteopathic Medicine

## 2016-03-25 VITALS — BP 113/65 | HR 74 | Temp 97.5°F | Ht 64.0 in | Wt 114.0 lb

## 2016-03-25 DIAGNOSIS — J0110 Acute frontal sinusitis, unspecified: Secondary | ICD-10-CM | POA: Diagnosis not present

## 2016-03-25 MED ORDER — AMOXICILLIN-POT CLAVULANATE 875-125 MG PO TABS: 1.0000 | ORAL_TABLET | Freq: Two times a day (BID) | ORAL | 0 refills | Status: DC

## 2016-03-25 MED ORDER — IPRATROPIUM BROMIDE 0.03 % NA SOLN: 2.0000 | Freq: Four times a day (QID) | NASAL | 0 refills | Status: DC

## 2016-03-25 NOTE — Progress Notes (Signed)
HPI: Taylor Simon is a 36 y.o. female who [facility] [place] [facility] for chief complaint of:  Chief Complaint  Patient presents with  . Cough    Acute Illness: Context: child recently had cold, she has had sore throat and cough, better except persistent cough and sinus congestion. OTC meds not helping.     Past medical, social and family history reviewed.   Immune compromising conditions or other risk factors: none  Current medications and allergies reviewed.     Review of Systems:  Constitutional: No  fever/chills  HEENT: Yes  headache, Yes  sore throat, No  swollen glands  Cardiovascular: No chest pain  Respiratory:Yes  cough, No  shortness of breath  Gastrointestinal: No  nausea, No  vomiting,  No  diarrhea  Musculoskeletal:   No  myalgia/arthralgia  Skin/Integument:  No rash   Detailed Exam:  BP 113/65   Pulse 74   Temp 97.5 F (36.4 C) (Oral)   Ht 5\' 4"  (1.626 m)   Wt 114 lb (51.7 kg)   BMI 19.57 kg/m   Constitutional:   VSS, see above.   General Appearance: alert, well-developed, well-nourished, NAD  Eyes:   Normal lids and conjunctive, non-icteric sclera  Ears, Nose, Mouth, Throat:   Normal external inspection ears/nares  Normal mouth/lips/gums, MMM  normal TM  posterior pharynx without erythema, without exudate  nasal mucosa normal  Skin:  Normal inspection, no rash or concerning lesions noted on limited exam  Neck:   No masses, trachea midline. normal lymph nodes  Respiratory:   Normal respiratory effort.   No  wheeze/rhonchi/rales  Cardiovascular:   S1/S2 normal, no murmur/rub/gallop auscultated. RRR.   No results found for this or any previous visit (from the past 72 hour(s)). No results found.   ASSESSMENT/PLAN:  Acute non-recurrent frontal sinusitis - Plan: amoxicillin-clavulanate (AUGMENTIN) 875-125 MG tablet, ipratropium (ATROVENT) 0.03 % nasal spray     Patient  Instructions  Note: the following list assumes no pregnancy, normal liver & kidney function and no other drug interactions. Dr. Lyn HollingsheadAlexander has highlighted medications which are safe for you to use for a cold, but these may not be appropriate for everyone. Always ask a pharmacist or qualified medical provider if there are any questions!    Aches/Pains, Fever Acetaminophen (Tylenol) 500 mg tablets - take max 2 tablets (1000 mg) every 6 hours (4 times per day)  Ibuprofen (Motrin) 200 mg tablets - take max 4 tablets (800 mg) every 6 hours  Sinus Congestion Prescription Atrovent Cromolyn Nasal Spray (NasalCrom) 1 spray each nostril 3-4 times per day, max 6 imes per day Nasal Saline if desired Oxymetolazone (Afrin, others) sparing use due to rebound congestion Phenylephrine (Sudafed) 10 mg tablets every 4 hours (or the 12-hour formulation) Diphenhydramine (Benadryl) 25 mg tablets - take max 2 tablets every 4 hours  Cough & Sore Throat Prescription cough pills or syrups Dextromethorphan (Robitussin, others) - cough suppressant Guaifenesin (Robitussin, Mucinex, others) - expectorant (helps cough up mucus) (Dextromethorphan and Guaifenesin also come in a combination tablet) Lozenges w/ Benzocaine + Menthol (Cepacol) Honey - as much as you want! Teas which "coat the throat" - look for ingredients Elm Bark, Licorice Root, Marshmallow Root  Other Zinc Lozenges within 24 hours of symptoms onset - mixed evidence this shortens the duration of the common cold Don't waste your money on Vitamin C or Echinacea       Visit summary was printed for the patient with medications and  pertinent instructions for patient to review. ER/RTC precautions reviewed. All questions answered. No Follow-up on file.

## 2016-03-25 NOTE — Patient Instructions (Signed)
Note: the following list assumes no pregnancy, normal liver & kidney function and no other drug interactions. Dr. Lyn HollingsheadAlexander has highlighted medications which are safe for you to use for a cold, but these may not be appropriate for everyone. Always ask a pharmacist or qualified medical provider if there are any questions!    Aches/Pains, Fever Acetaminophen (Tylenol) 500 mg tablets - take max 2 tablets (1000 mg) every 6 hours (4 times per day)  Ibuprofen (Motrin) 200 mg tablets - take max 4 tablets (800 mg) every 6 hours  Sinus Congestion Prescription Atrovent Cromolyn Nasal Spray (NasalCrom) 1 spray each nostril 3-4 times per day, max 6 imes per day Nasal Saline if desired Oxymetolazone (Afrin, others) sparing use due to rebound congestion Phenylephrine (Sudafed) 10 mg tablets every 4 hours (or the 12-hour formulation) Diphenhydramine (Benadryl) 25 mg tablets - take max 2 tablets every 4 hours  Cough & Sore Throat Prescription cough pills or syrups Dextromethorphan (Robitussin, others) - cough suppressant Guaifenesin (Robitussin, Mucinex, others) - expectorant (helps cough up mucus) (Dextromethorphan and Guaifenesin also come in a combination tablet) Lozenges w/ Benzocaine + Menthol (Cepacol) Honey - as much as you want! Teas which "coat the throat" - look for ingredients Elm Bark, Licorice Root, Marshmallow Root  Other Zinc Lozenges within 24 hours of symptoms onset - mixed evidence this shortens the duration of the common cold Don't waste your money on Vitamin C or Echinacea

## 2016-06-17 DIAGNOSIS — M25572 Pain in left ankle and joints of left foot: Secondary | ICD-10-CM | POA: Diagnosis not present

## 2016-06-21 DIAGNOSIS — M25572 Pain in left ankle and joints of left foot: Secondary | ICD-10-CM | POA: Diagnosis not present

## 2016-07-06 DIAGNOSIS — R6889 Other general symptoms and signs: Secondary | ICD-10-CM | POA: Diagnosis not present

## 2016-07-06 DIAGNOSIS — M25572 Pain in left ankle and joints of left foot: Secondary | ICD-10-CM | POA: Diagnosis not present

## 2016-07-09 DIAGNOSIS — J029 Acute pharyngitis, unspecified: Secondary | ICD-10-CM | POA: Diagnosis not present

## 2016-07-15 DIAGNOSIS — R6889 Other general symptoms and signs: Secondary | ICD-10-CM | POA: Diagnosis not present

## 2016-07-15 DIAGNOSIS — M25572 Pain in left ankle and joints of left foot: Secondary | ICD-10-CM | POA: Diagnosis not present

## 2016-07-20 DIAGNOSIS — M25572 Pain in left ankle and joints of left foot: Secondary | ICD-10-CM | POA: Diagnosis not present

## 2016-07-20 DIAGNOSIS — R6889 Other general symptoms and signs: Secondary | ICD-10-CM | POA: Diagnosis not present

## 2016-07-27 DIAGNOSIS — M25572 Pain in left ankle and joints of left foot: Secondary | ICD-10-CM | POA: Diagnosis not present

## 2016-07-27 DIAGNOSIS — R6889 Other general symptoms and signs: Secondary | ICD-10-CM | POA: Diagnosis not present

## 2018-01-23 ENCOUNTER — Ambulatory Visit (INDEPENDENT_AMBULATORY_CARE_PROVIDER_SITE_OTHER): Payer: BLUE CROSS/BLUE SHIELD

## 2018-01-23 ENCOUNTER — Encounter: Payer: Self-pay | Admitting: Sports Medicine

## 2018-01-23 ENCOUNTER — Ambulatory Visit: Payer: BLUE CROSS/BLUE SHIELD | Admitting: Sports Medicine

## 2018-01-23 DIAGNOSIS — M545 Low back pain, unspecified: Secondary | ICD-10-CM

## 2018-01-23 MED ORDER — PREDNISONE 50 MG PO TABS: ORAL_TABLET | ORAL | 0 refills | Status: DC

## 2018-01-23 NOTE — Addendum Note (Signed)
Addended by: Monica Becton on: 01/23/2018 03:45 PM   Modules accepted: Orders

## 2018-01-23 NOTE — Progress Notes (Addendum)
Subjective:    I'm seeing this patient as a consultation for: Tandy Gaw, PA-C  CC: Right hip and back pain  HPI: This is a pleasant 38 year old female, for the past several days she has had severe pain in her right side of the low back with radiation around to the right hip, at the level of the iliac crest.  Worse with sitting, prolonged standing.  No bowel or bladder dysfunction, no saddle numbness, no constitutional symptoms, pain does radiate into the buttock and posterior thigh but not past the knee.  I reviewed the past medical history, family history, social history, surgical history, and allergies today and no changes were needed.  Please see the problem list section below in epic for further details.  Past Medical History: Past Medical History:  Diagnosis Date  . Ovarian cyst    Past Surgical History: Past Surgical History:  Procedure Laterality Date  . WISDOM TOOTH EXTRACTION     Social History: Social History   Socioeconomic History  . Marital status: Divorced    Spouse name: Not on file  . Number of children: Not on file  . Years of education: Not on file  . Highest education level: Not on file  Occupational History  . Not on file  Social Needs  . Financial resource strain: Not on file  . Food insecurity:    Worry: Not on file    Inability: Not on file  . Transportation needs:    Medical: Not on file    Non-medical: Not on file  Tobacco Use  . Smoking status: Never Smoker  . Smokeless tobacco: Never Used  Substance and Sexual Activity  . Alcohol use: Yes    Comment: occassion  . Drug use: No  . Sexual activity: Yes    Partners: Male  Lifestyle  . Physical activity:    Days per week: Not on file    Minutes per session: Not on file  . Stress: Not on file  Relationships  . Social connections:    Talks on phone: Not on file    Gets together: Not on file    Attends religious service: Not on file    Active member of club or organization: Not on  file    Attends meetings of clubs or organizations: Not on file    Relationship status: Not on file  Other Topics Concern  . Not on file  Social History Narrative  . Not on file   Family History: Family History  Problem Relation Age of Onset  . Cancer Mother 62       Breast   Allergies: No Known Allergies Medications: See med rec.  Review of Systems: No headache, visual changes, nausea, vomiting, diarrhea, constipation, dizziness, abdominal pain, skin rash, fevers, chills, night sweats, weight loss, swollen lymph nodes, body aches, joint swelling, muscle aches, chest pain, shortness of breath, mood changes, visual or auditory hallucinations.   Objective:   General: Well Developed, well nourished, and in no acute distress.  Neuro:  Extra-ocular muscles intact, able to move all 4 extremities, sensation grossly intact.  Deep tendon reflexes tested were normal. Psych: Alert and oriented, mood congruent with affect. ENT:  Ears and nose appear unremarkable.  Hearing grossly normal. Neck: Unremarkable overall appearance, trachea midline.  No visible thyroid enlargement. Eyes: Conjunctivae and lids appear unremarkable.  Pupils equal and round. Skin: Warm and dry, no rashes noted.  Cardiovascular: Pulses palpable, no extremity edema. Back Exam:  Inspection: Unremarkable  Motion: Flexion  45 deg, Extension 45 deg, Side Bending to 45 deg bilaterally,  Rotation to 45 deg bilaterally  SLR laying: Negative  XSLR laying: Negative  Palpable tenderness: None, specifically no tenderness over the right sacroiliac joint. FABER: negative. Sensory change: Gross sensation intact to all lumbar and sacral dermatomes.  Reflexes: 2+ at both patellar tendons, 2+ at achilles tendons, Babinski's downgoing.  Strength at foot  Plantar-flexion: 5/5 Dorsi-flexion: 5/5 Eversion: 5/5 Inversion: 5/5  Leg strength  Quad: 5/5 Hamstring: 5/5 Hip flexor: 5/5 Hip abductors: 5/5  Gait unremarkable.  Impression and  Recommendations:   This case required medical decision making of moderate complexity.  Low back pain Axial low back pain, right-sided. Likely discogenic. Adding 5 days of prednisone, x-rays. Home rehab exercises given, return to see me in 1 month, MR for interventional planning if no better.  There is potential right-sided nephrolithiasis, I would like her to return for a renal ultrasound and urinalysis in the lab.  Continue medications as previously discussed. ___________________________________________ Ihor Austin. Benjamin Stain, M.D., ABFM., CAQSM. Primary Care and Sports Medicine [facility]  Adjunct Professor of Family Medicine  [employer]

## 2018-01-23 NOTE — Assessment & Plan Note (Addendum)
Axial low back pain, right-sided. Likely discogenic. Adding 5 days of prednisone, x-rays. Home rehab exercises given, return to see me in 1 month, MR for interventional planning if no better.  There is potential right-sided nephrolithiasis, I would like her to return for a renal ultrasound and urinalysis in the lab.  Continue medications as previously discussed.

## 2018-01-24 DIAGNOSIS — Z01419 Encounter for gynecological examination (general) (routine) without abnormal findings: Secondary | ICD-10-CM | POA: Diagnosis not present

## 2018-01-24 DIAGNOSIS — Z6820 Body mass index (BMI) 20.0-20.9, adult: Secondary | ICD-10-CM | POA: Diagnosis not present

## 2018-01-24 DIAGNOSIS — Z1151 Encounter for screening for human papillomavirus (HPV): Secondary | ICD-10-CM | POA: Diagnosis not present

## 2018-01-24 DIAGNOSIS — Z118 Encounter for screening for other infectious and parasitic diseases: Secondary | ICD-10-CM | POA: Diagnosis not present

## 2018-01-24 DIAGNOSIS — Z124 Encounter for screening for malignant neoplasm of cervix: Secondary | ICD-10-CM | POA: Diagnosis not present

## 2018-01-25 ENCOUNTER — Ambulatory Visit (HOSPITAL_BASED_OUTPATIENT_CLINIC_OR_DEPARTMENT_OTHER)
Admission: RE | Admit: 2018-01-25 | Discharge: 2018-01-25 | Disposition: A | Discharge status: Home / Self Care | Payer: BLUE CROSS/BLUE SHIELD | Source: Ambulatory Visit | Attending: Sports Medicine | Admitting: Sports Medicine

## 2018-01-25 DIAGNOSIS — M545 Low back pain, unspecified: Secondary | ICD-10-CM

## 2018-01-25 DIAGNOSIS — R109 Unspecified abdominal pain: Secondary | ICD-10-CM | POA: Diagnosis not present

## 2018-02-16 DIAGNOSIS — N920 Excessive and frequent menstruation with regular cycle: Secondary | ICD-10-CM | POA: Diagnosis not present

## 2018-02-16 DIAGNOSIS — N946 Dysmenorrhea, unspecified: Secondary | ICD-10-CM | POA: Diagnosis not present

## 2018-02-20 ENCOUNTER — Encounter: Payer: Self-pay | Admitting: Sports Medicine

## 2018-02-20 ENCOUNTER — Ambulatory Visit: Payer: BLUE CROSS/BLUE SHIELD | Admitting: Sports Medicine

## 2018-02-20 DIAGNOSIS — M545 Low back pain, unspecified: Secondary | ICD-10-CM

## 2018-02-20 NOTE — Assessment & Plan Note (Signed)
Axial right-sided discogenic back pain. About 80% better with 5 days of prednisone, rehabilitation exercises. Continues to improve, we can continue conservative measures for now. She did have potential right-sided nephrolithiasis but her renal ultrasound was negative.

## 2018-02-20 NOTE — Progress Notes (Signed)
  Subjective:    CC: Follow-up  HPI: Improving considerably.  I reviewed the past medical history, family history, social history, surgical history, and allergies today and no changes were needed.  Please see the problem list section below in epic for further details.  Past Medical History: Past Medical History:  Diagnosis Date  . Ovarian cyst    Past Surgical History: Past Surgical History:  Procedure Laterality Date  . WISDOM TOOTH EXTRACTION     Social History: Social History   Socioeconomic History  . Marital status: Divorced    Spouse name: Not on file  . Number of children: Not on file  . Years of education: Not on file  . Highest education level: Not on file  Occupational History  . Not on file  Social Needs  . Financial resource strain: Not on file  . Food insecurity:    Worry: Not on file    Inability: Not on file  . Transportation needs:    Medical: Not on file    Non-medical: Not on file  Tobacco Use  . Smoking status: Never Smoker  . Smokeless tobacco: Never Used  Substance and Sexual Activity  . Alcohol use: Yes    Comment: occassion  . Drug use: No  . Sexual activity: Yes    Partners: Male  Lifestyle  . Physical activity:    Days per week: Not on file    Minutes per session: Not on file  . Stress: Not on file  Relationships  . Social connections:    Talks on phone: Not on file    Gets together: Not on file    Attends religious service: Not on file    Active member of club or organization: Not on file    Attends meetings of clubs or organizations: Not on file    Relationship status: Not on file  Other Topics Concern  . Not on file  Social History Narrative  . Not on file   Family History: Family History  Problem Relation Age of Onset  . Cancer Mother 70       Breast   Allergies: No Known Allergies Medications: See med rec.  Review of Systems: No fevers, chills, night sweats, weight loss, chest pain, or shortness of breath.    Objective:    General: Well Developed, well nourished, and in no acute distress.  Neuro: Alert and oriented x3, extra-ocular muscles intact, sensation grossly intact.  HEENT: Normocephalic, atraumatic, pupils equal round reactive to light, neck supple, no masses, no lymphadenopathy, thyroid nonpalpable.  Skin: Warm and dry, no rashes. Cardiac: Regular rate and rhythm, no murmurs rubs or gallops, no lower extremity edema.  Respiratory: Clear to auscultation bilaterally. Not using accessory muscles, speaking in full sentences.  Impression and Recommendations:    Low back pain Axial right-sided discogenic back pain. About 80% better with 5 days of prednisone, rehabilitation exercises. Continues to improve, we can continue conservative measures for now. She did have potential right-sided nephrolithiasis but her renal ultrasound was negative. ___________________________________________ Ihor Austin. Benjamin Stain, M.D., ABFM., CAQSM. Primary Care and Sports Medicine [facility]  Adjunct Professor of Family Medicine  [employer]

## 2018-03-21 DIAGNOSIS — M545 Low back pain: Secondary | ICD-10-CM | POA: Diagnosis not present

## 2018-09-14 DIAGNOSIS — M545 Low back pain: Secondary | ICD-10-CM | POA: Diagnosis not present

## 2018-09-21 DIAGNOSIS — M545 Low back pain: Secondary | ICD-10-CM | POA: Diagnosis not present

## 2018-10-06 DIAGNOSIS — M545 Low back pain: Secondary | ICD-10-CM | POA: Diagnosis not present

## 2018-12-14 DIAGNOSIS — M545 Low back pain: Secondary | ICD-10-CM | POA: Diagnosis not present

## 2019-05-31 IMAGING — DX DG LUMBAR SPINE COMPLETE 4+V
5 series · 5 of 5 positions shown · non-contrast
Comparison: None.

CLINICAL DATA: Right-sided low back pain without sciatica.

EXAM:
LUMBAR SPINE - COMPLETE 4+ VIEW

[l-spine ap]
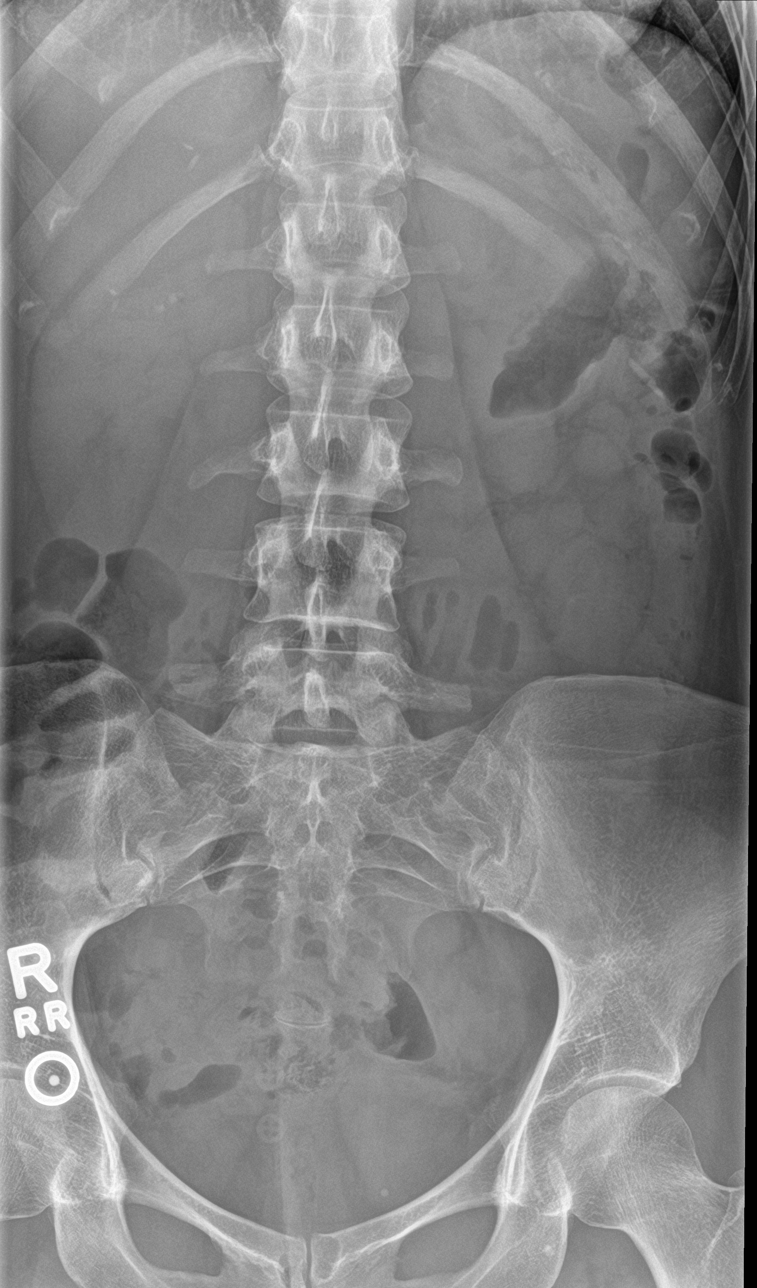

[l-spine obl (1 of 2)]
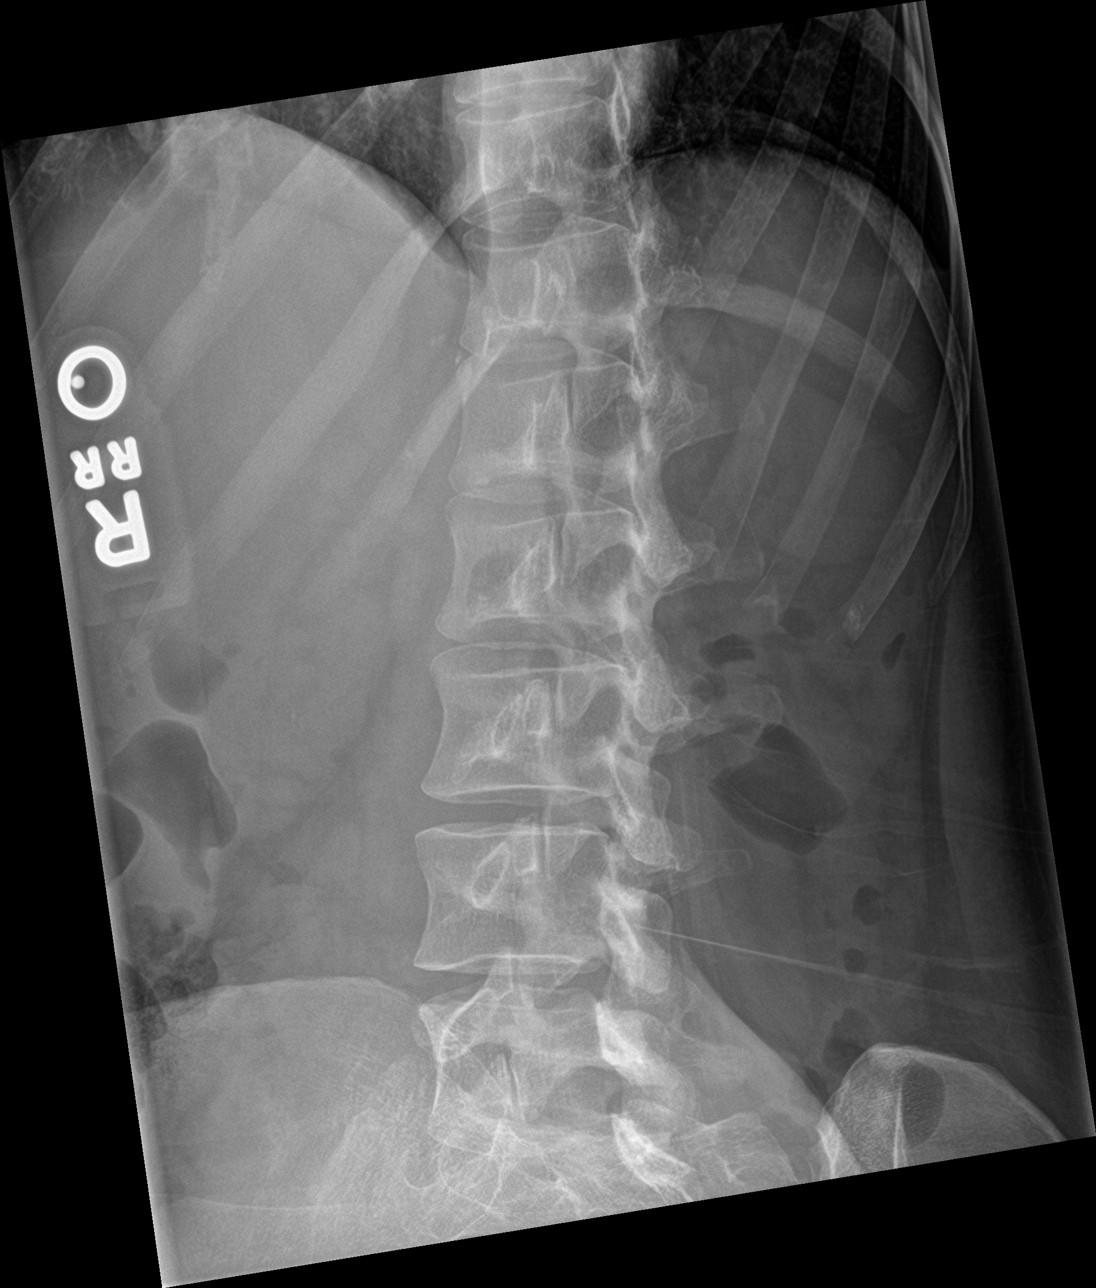

[l-spine obl (2 of 2)]
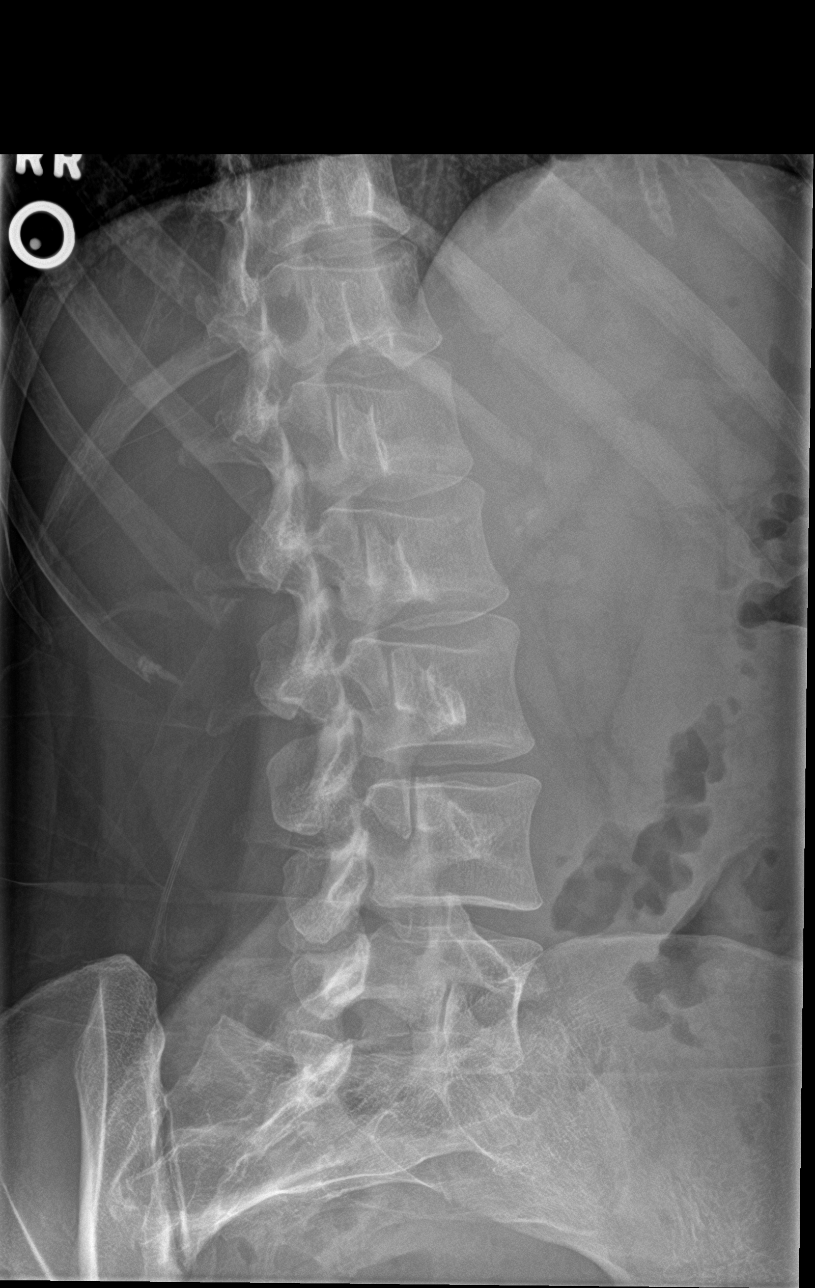

[l-spine lat]
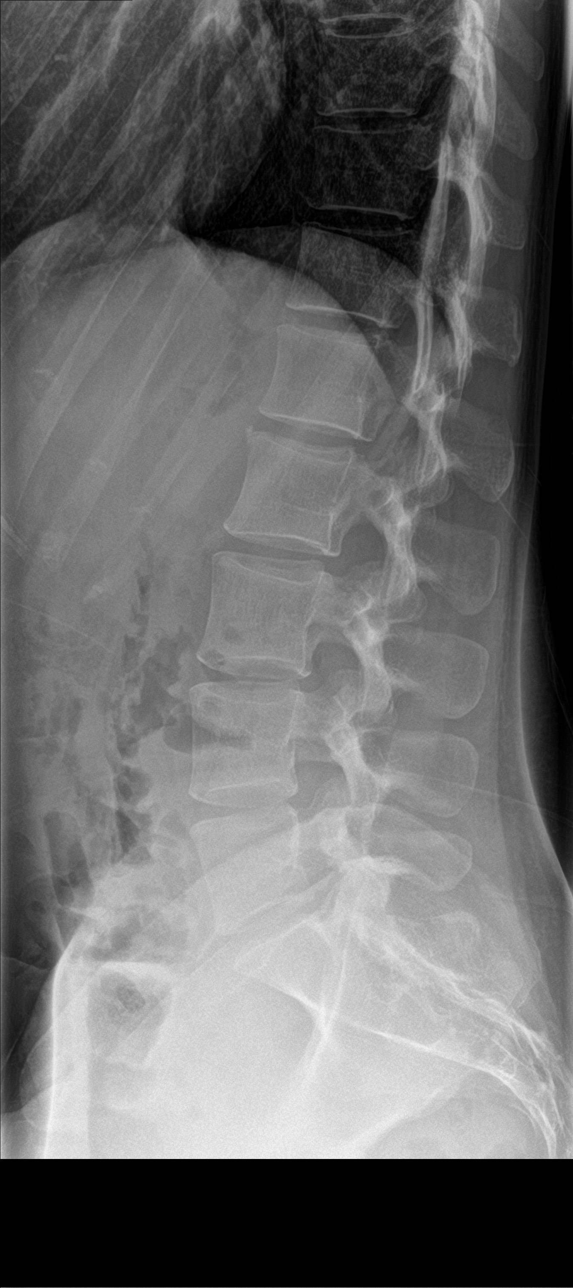

[l-spine spot]
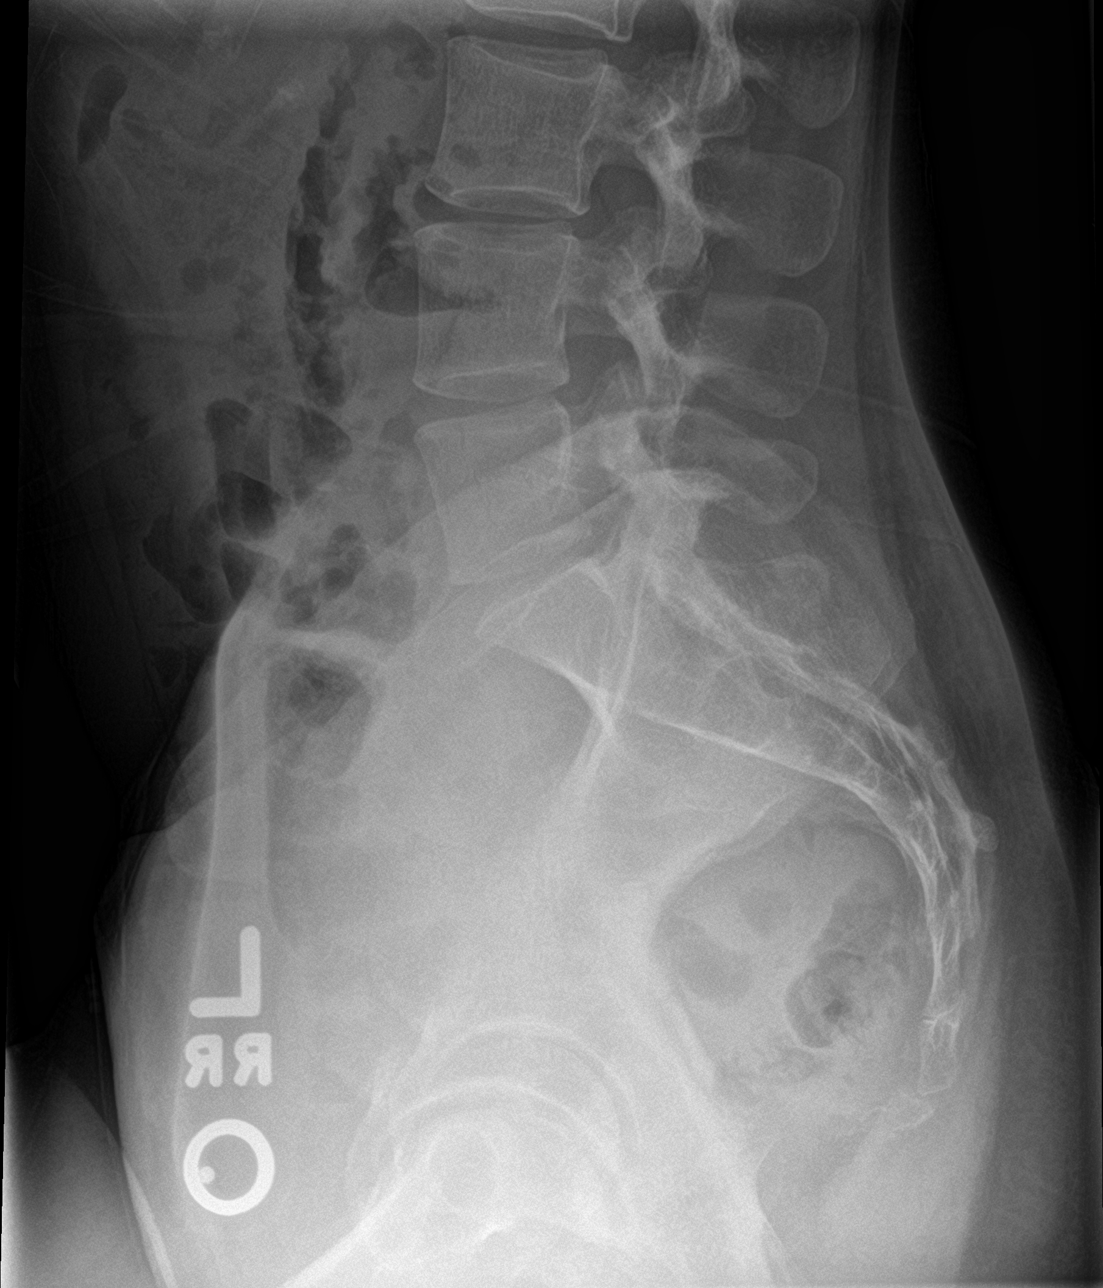

[5 of 5 positions shown; findings below may reference images not displayed]

FINDINGS: There are 5 non rib-bearing lumbar type vertebral bodies.

Normal alignment of the lumbar spine. No anterolisthesis or
retrolisthesis. No definite pars defects.

Lumbar vertebral body heights appear preserved.

Lumbar intervertebral disc space heights appear preserved. There is
partial ossification involving the anterior inferior aspect of the
L1-L2 intervertebral disc.

Limited visualization of the bilateral SI joints and hips is normal.

Ill-defined calcifications overlie the expected location of the
superior pole the right kidney with dominant opacity measuring
approximately 3 mm. Punctate phlebolith overlies the left
hemipelvis. Post cholecystectomy. Regional bowel gas pattern is
normal.
IMPRESSION: 1. No explanation for patient's right-sided sciatica symptoms.
2. Potential right-sided nephrolithiasis. Clinical correlation is
advised. Further evaluation could be could be performed with renal
ultrasound and/or noncontrast CT scan of the abdomen pelvis as
indicated.

## 2019-08-18 DIAGNOSIS — S60211A Contusion of right wrist, initial encounter: Secondary | ICD-10-CM | POA: Diagnosis not present

## 2020-01-15 ENCOUNTER — Other Ambulatory Visit: Payer: Self-pay

## 2020-01-15 ENCOUNTER — Ambulatory Visit (INDEPENDENT_AMBULATORY_CARE_PROVIDER_SITE_OTHER): Payer: BC Managed Care – PPO | Admitting: Medical-Surgical

## 2020-01-15 ENCOUNTER — Encounter: Payer: Self-pay | Admitting: Medical-Surgical

## 2020-01-15 VITALS — BP 104/73 | HR 78 | Temp 98.0°F | Ht 64.0 in | Wt 112.5 lb

## 2020-01-15 DIAGNOSIS — H9203 Otalgia, bilateral: Secondary | ICD-10-CM

## 2020-01-15 DIAGNOSIS — J0140 Acute pansinusitis, unspecified: Secondary | ICD-10-CM | POA: Diagnosis not present

## 2020-01-15 MED ORDER — AZITHROMYCIN 250 MG PO TABS: ORAL_TABLET | ORAL | 0 refills | Status: AC

## 2020-01-15 NOTE — Progress Notes (Signed)
Subjective:    CC: Bilateral ear pain  HPI: Pleasant 40 year old female presenting today with reports of bilateral ear pain for the last week along with swollen glands in her neck.  Notes that she and her family had a viral upper respiratory infection about 3 weeks ago which has overall resolved.  She does have some maxillary sinus tenderness that has not resolved and wonders if her ears could be hurting because of sinus drainage.  Denies hearing loss, tinnitus, ear drainage, or popping.  Denies fever, chills, sore throat, shortness of breath, chest pain, sinus congestion.  She and her family were all tested for Covid with negative results.  She has tried taking Tylenol which has not been helpful for her ear pain.  I reviewed the past medical history, family history, social history, surgical history, and allergies today and no changes were needed.  Please see the problem list section below in epic for further details.  Past Medical History: Past Medical History:  Diagnosis Date  . Ovarian cyst    Past Surgical History: Past Surgical History:  Procedure Laterality Date  . WISDOM TOOTH EXTRACTION     Social History: Social History   Socioeconomic History  . Marital status: Divorced    Spouse name: Not on file  . Number of children: Not on file  . Years of education: Not on file  . Highest education level: Not on file  Occupational History  . Not on file  Tobacco Use  . Smoking status: Never Smoker  . Smokeless tobacco: Never Used  Substance and Sexual Activity  . Alcohol use: Yes    Comment: occassion  . Drug use: No  . Sexual activity: Yes    Partners: Male  Other Topics Concern  . Not on file  Social History Narrative  . Not on file   Social Determinants of Health   Financial Resource Strain: Not on file  Food Insecurity: Not on file  Transportation Needs: Not on file  Physical Activity: Not on file  Stress: Not on file  Social Connections: Not on file   Family  History: Family History  Problem Relation Age of Onset  . Cancer Mother 99       Breast   Allergies: No Known Allergies Medications: See med rec.  Review of Systems: See HPI for pertinent positives and negatives.   Objective:    General: Well Developed, well nourished, and in no acute distress.  Neuro: Alert and oriented x3 HEENT: Normocephalic, atraumatic, pupils equal round reactive to light, neck supple, no masses, mild submandibular lymphadenopathy, thyroid nonpalpable.  Bilateral external ear canals slightly erythematous without drainage.  TMs intact bilaterally, slight bulging but no erythema or cloudiness noted. Skin: Warm and dry. Cardiac: Regular rate and rhythm, no murmurs rubs or gallops, no lower extremity edema.  Respiratory: Clear to auscultation bilaterally. Not using accessory muscles, speaking in full sentences.  Impression and Recommendations:    1. Otalgia, bilateral Recommend adding a daily oral antihistamine along with Flonase or other intranasal corticosteroid spray.  Consider a few days of as needed Sudafed to see if this helps reduce the pressure and relieve your pain.  No obvious indicators for otitis media.  2.  Acute nonrecurrent pansinusitis With the significance of maxillary and frontal tenderness on exam and the length of her symptoms, we will go ahead and treat with  azithromycin.  Discussed conservative measures including nasal saline rinses/sprays, decongestants, Mucinex, and humidifiers.  Return if symptoms worsen or fail to improve. ___________________________________________  Clearnce Sorrel, DNP, APRN, FNP-BC Primary Care and [place]

## 2020-01-25 ENCOUNTER — Telehealth: Payer: BC Managed Care – PPO | Admitting: Nurse Practitioner

## 2020-01-25 ENCOUNTER — Telehealth: Payer: Self-pay

## 2020-01-25 NOTE — Telephone Encounter (Signed)
Pt aware of recommendations and verbalized understanding. No further questions or concerns at this time.

## 2020-01-25 NOTE — Telephone Encounter (Signed)
This is appropriate.   Marland Kitchen.Vitamin D3 5000 IU (125 mcg) daily Vitamin C 500 mg twice daily Zinc 50 to 75 mg daily  Symptomatic care and hydration. Any sudden worsening or symptoms or chest pain go to UC/ED.

## 2020-01-25 NOTE — Telephone Encounter (Addendum)
Pt states that while taking the azithromycin prescribed on 01/15/2020 she started to feel better, but once she completed it all of the sx returned. Per last OV note, she was to return is sx got worse or did not improve. Pt agreeable to a virtual visit. Pt scheduled for VV with Les Pou Early, DNP, AGNP-C this afternoon at 4:10. This was the only appointment opening today. Text sent to pt so she can get signed up for the MyChart portal.

## 2020-01-25 NOTE — Telephone Encounter (Signed)
Pt called back and said that she re-rested herself for COVID and it came back positive. With this new information, she said she wanted to cancel the appt that was scheduled for her this afternoon and just quarantine herself and her family.

## 2020-07-27 IMAGING — US US RENAL
1 series · 14 of 25 positions shown · non-contrast
Comparison: None.

CLINICAL DATA: Right flank and back pain for 2 weeks. Increasing.
History of ovarian cysts.

EXAM:
RENAL / URINARY TRACT ULTRASOUND COMPLETE

[Series 1: us renal · 0.22mm/px · 14 of 44 slices shown]
[im 1/44]
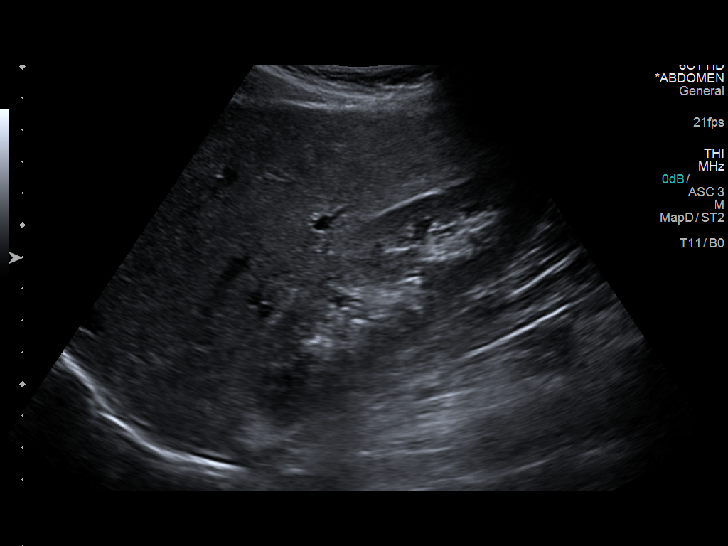
[im 4/44]
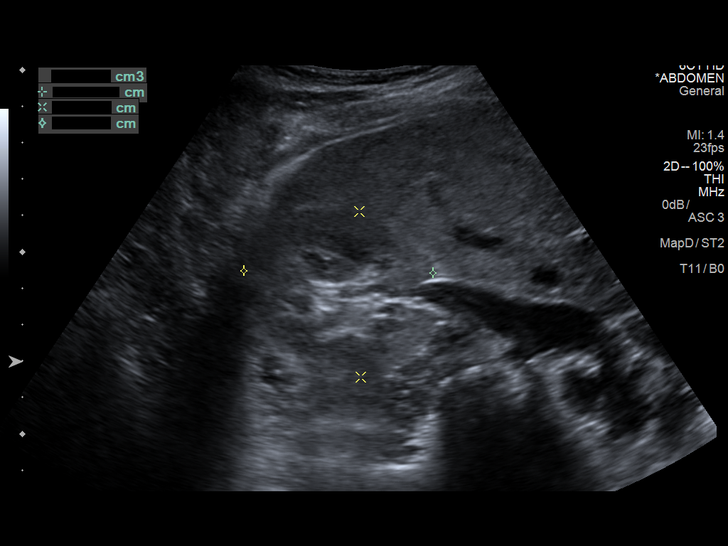
[im 8/44]
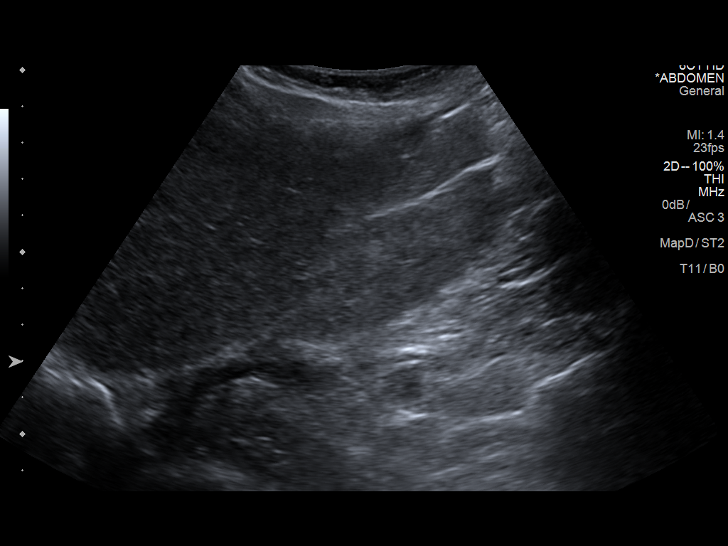
[im 11/44]
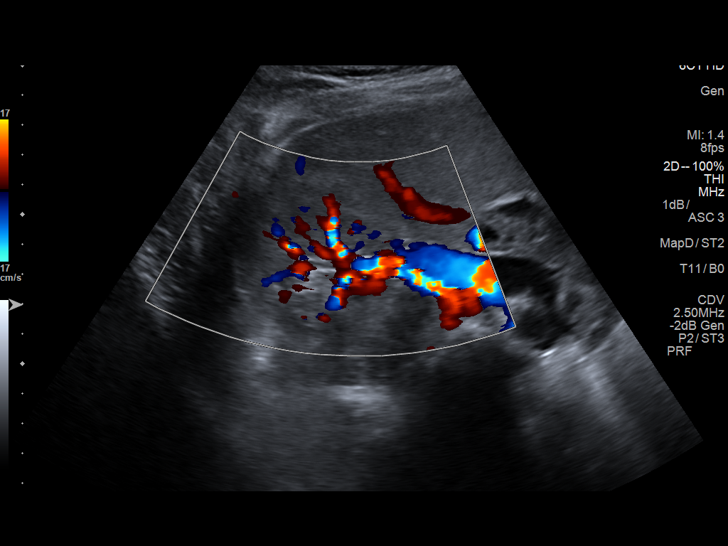
[im 15/44]
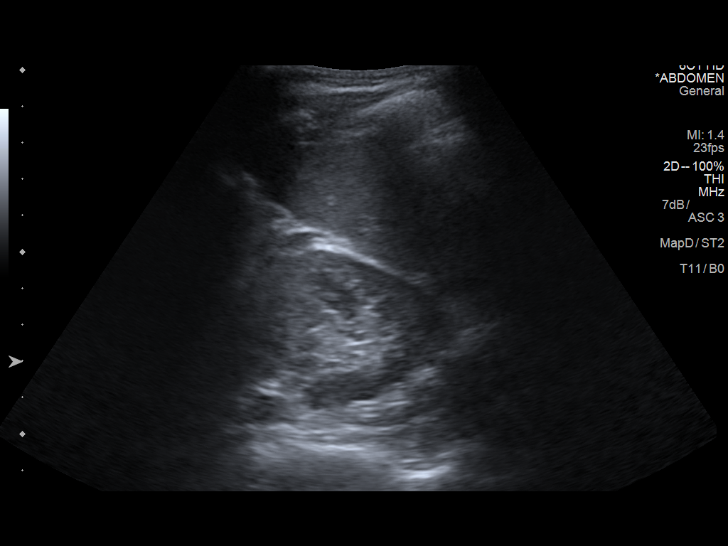
[im 17/44]
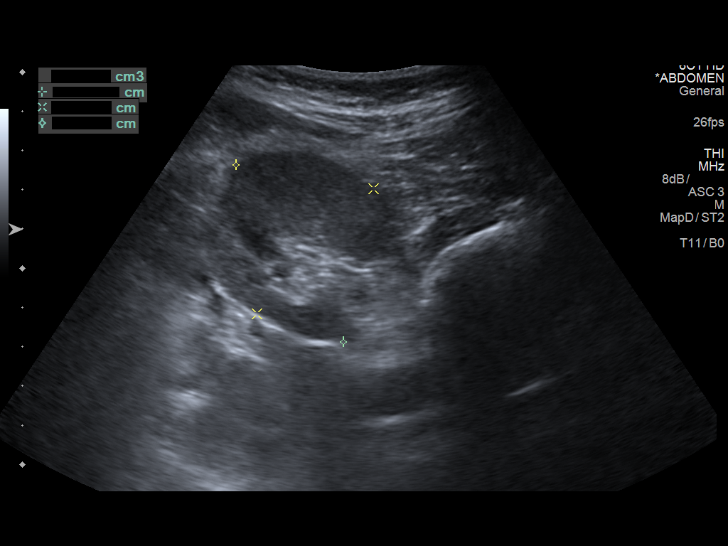
[im 20/44]
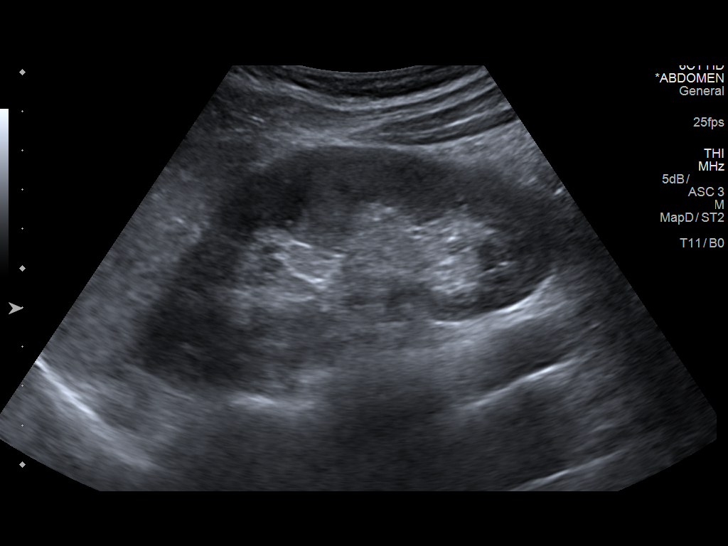
[im 24/44]
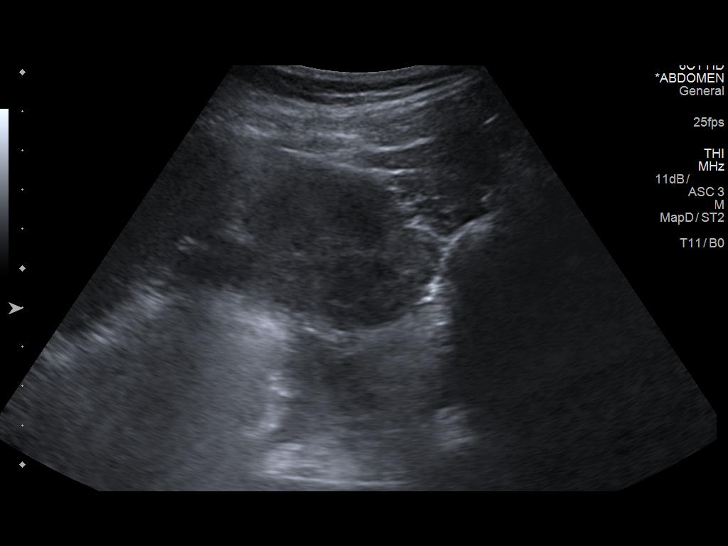
[im 27/44]
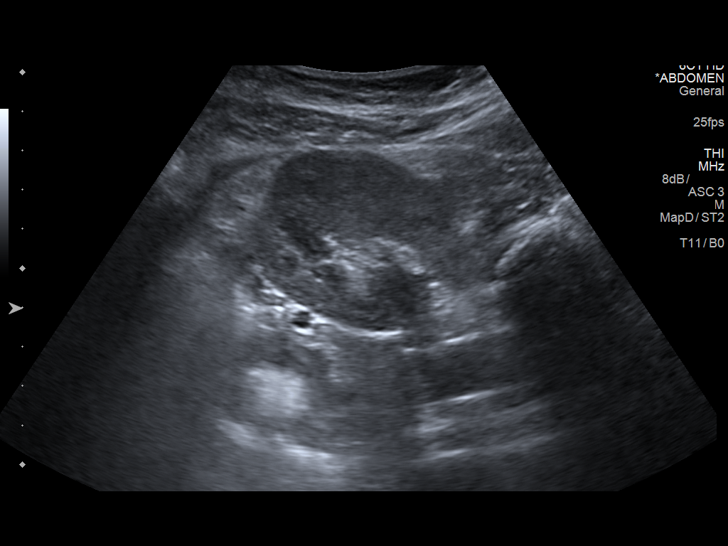
[im 29/44]
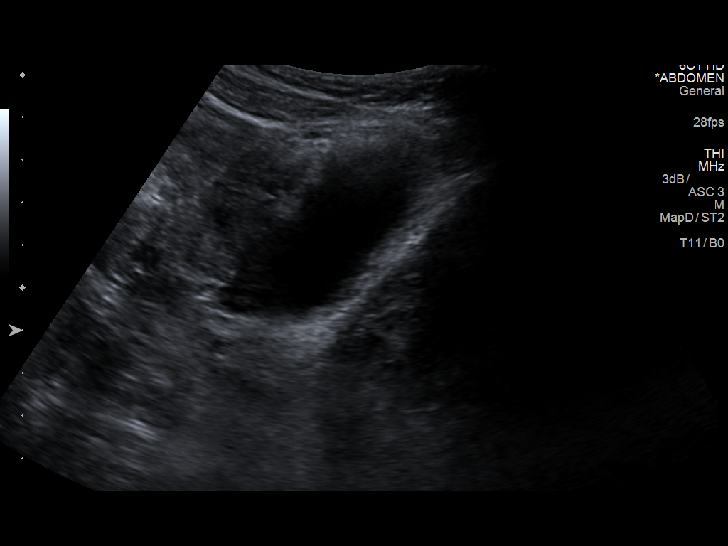
[im 33/44]
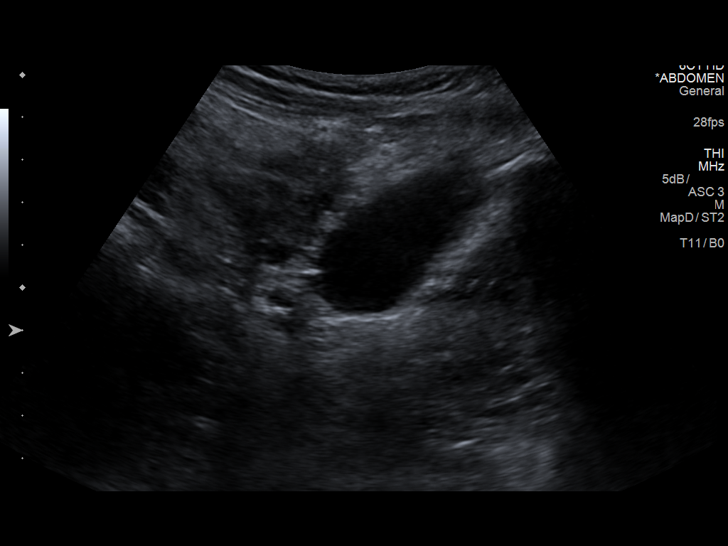
[im 36/44]
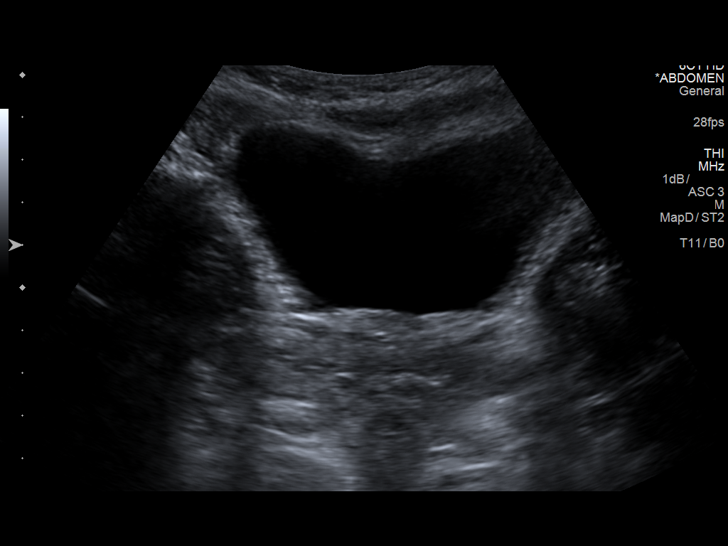
[im 40/44]
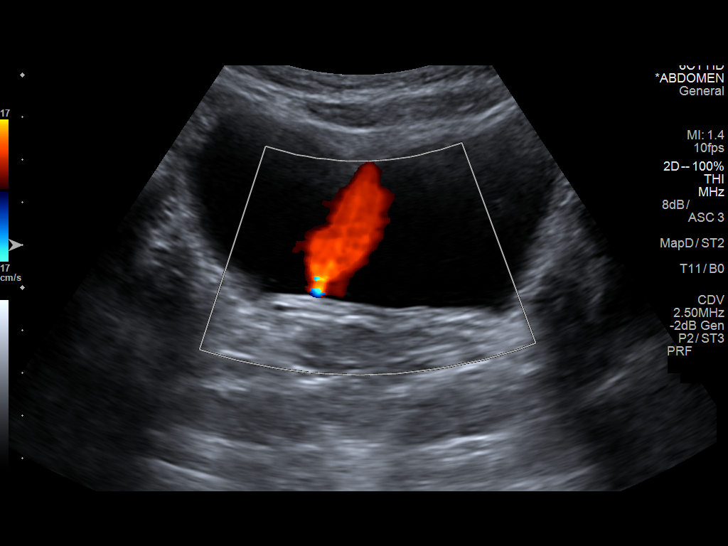
[im 44/44]
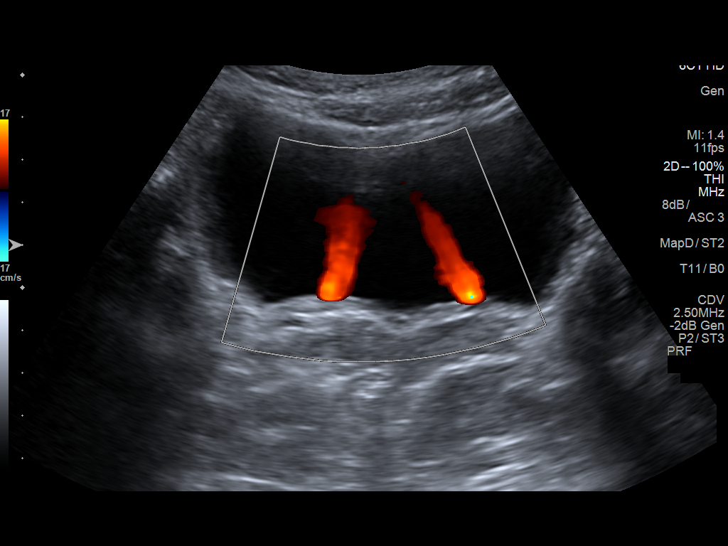

[14 of 25 positions shown; findings below may reference images not displayed]

FINDINGS: Right Kidney:

Renal measurements: 12 x 4.6 x 5.2 cm = volume: 149 mL .
Echogenicity within normal limits. No mass or hydronephrosis
visualized.

Left Kidney:

Renal measurements: 10.9 x 4.4 x 5.3 cm = volume: 132 mL.
Echogenicity within normal limits. No mass or hydronephrosis
visualized.

Bladder:

Normal distended bladder. No wall thickening or filling defects.
Bilateral urine flow jets seen with color flow Doppler imaging.
Postvoid imaging not obtained.
IMPRESSION: Normal ultrasound appearance of the kidneys and bladder.

## 2022-05-14 DIAGNOSIS — M5459 Other low back pain: Secondary | ICD-10-CM | POA: Diagnosis not present

## 2024-02-01 ENCOUNTER — Encounter: Payer: Self-pay | Admitting: Physician Assistant
